# Patient Record
Sex: Male | Born: 1960 | Race: Black or African American | Hispanic: No | Marital: Married | Smoking: Current every day smoker
Health system: Southern US, Community
[De-identification: ages and names within clinical notes are randomized; demographics above are authoritative.]

## PROBLEM LIST (undated history)

## (undated) DIAGNOSIS — I10 Essential (primary) hypertension: Secondary | ICD-10-CM

## (undated) DIAGNOSIS — E785 Hyperlipidemia, unspecified: Secondary | ICD-10-CM

## (undated) HISTORY — DX: Hyperlipidemia, unspecified: E78.5

## (undated) HISTORY — DX: Essential (primary) hypertension: I10

---

## 1997-10-29 ENCOUNTER — Encounter: Admission: RE | Admit: 1997-10-29 | Discharge: 1997-10-29 | Payer: Self-pay | Admitting: *Deleted

## 2017-06-15 ENCOUNTER — Ambulatory Visit: Payer: 59 | Admitting: Urgent Care

## 2017-06-15 ENCOUNTER — Other Ambulatory Visit: Payer: Self-pay

## 2017-06-15 ENCOUNTER — Ambulatory Visit (INDEPENDENT_AMBULATORY_CARE_PROVIDER_SITE_OTHER): Payer: 59

## 2017-06-15 ENCOUNTER — Encounter: Payer: Self-pay | Admitting: Urgent Care

## 2017-06-15 VITALS — BP 126/84 | HR 91 | Resp 16 | Ht 72.0 in | Wt 212.0 lb

## 2017-06-15 DIAGNOSIS — Z114 Encounter for screening for human immunodeficiency virus [HIV]: Secondary | ICD-10-CM

## 2017-06-15 DIAGNOSIS — R05 Cough: Secondary | ICD-10-CM

## 2017-06-15 DIAGNOSIS — Z1159 Encounter for screening for other viral diseases: Secondary | ICD-10-CM | POA: Diagnosis not present

## 2017-06-15 DIAGNOSIS — Z130 Encounter for screening for diseases of the blood and blood-forming organs and certain disorders involving the immune mechanism: Secondary | ICD-10-CM | POA: Diagnosis not present

## 2017-06-15 DIAGNOSIS — R0602 Shortness of breath: Secondary | ICD-10-CM | POA: Diagnosis not present

## 2017-06-15 DIAGNOSIS — Z833 Family history of diabetes mellitus: Secondary | ICD-10-CM | POA: Diagnosis not present

## 2017-06-15 DIAGNOSIS — Z0000 Encounter for general adult medical examination without abnormal findings: Secondary | ICD-10-CM

## 2017-06-15 DIAGNOSIS — Z1211 Encounter for screening for malignant neoplasm of colon: Secondary | ICD-10-CM

## 2017-06-15 DIAGNOSIS — F17200 Nicotine dependence, unspecified, uncomplicated: Secondary | ICD-10-CM | POA: Diagnosis not present

## 2017-06-15 DIAGNOSIS — R0789 Other chest pain: Secondary | ICD-10-CM | POA: Diagnosis not present

## 2017-06-15 DIAGNOSIS — R059 Cough, unspecified: Secondary | ICD-10-CM

## 2017-06-15 DIAGNOSIS — Z131 Encounter for screening for diabetes mellitus: Secondary | ICD-10-CM | POA: Diagnosis not present

## 2017-06-15 DIAGNOSIS — Z13220 Encounter for screening for lipoid disorders: Secondary | ICD-10-CM | POA: Diagnosis not present

## 2017-06-15 DIAGNOSIS — R079 Chest pain, unspecified: Secondary | ICD-10-CM | POA: Diagnosis not present

## 2017-06-15 DIAGNOSIS — R5383 Other fatigue: Secondary | ICD-10-CM

## 2017-06-15 DIAGNOSIS — Z1329 Encounter for screening for other suspected endocrine disorder: Secondary | ICD-10-CM

## 2017-06-15 DIAGNOSIS — Z23 Encounter for immunization: Secondary | ICD-10-CM | POA: Diagnosis not present

## 2017-06-15 MED ORDER — ALBUTEROL SULFATE HFA 108 (90 BASE) MCG/ACT IN AERS: 2.0000 | INHALATION_SPRAY | Freq: Four times a day (QID) | RESPIRATORY_TRACT | 1 refills | Status: DC | PRN

## 2017-06-15 NOTE — Progress Notes (Addendum)
MRN: [id number]  Subjective:   [name] is a 57 y.o. male presenting for annual physical exam and office visit for chest pain. Reports 1 week history of lower chest pain, fatigue, shortness of breath, malaise. Patient has 20 pack year smoking history of is currently smoking 1/2ppd, is ready to quit smoking. Denies ROS as below. Works as a Systems analyst. Patient is married, has 4 kids. Has good relationships at home, has a good support network.   Medical care team includes: PCP: Patient, No Pcp Per Vision: Had an eye exam done 2 months ago, wearing new glasses. Dental: Has upper dentures, has an appointment for dental care later today. Specialists: None.  Health Maintenance: Has never had a colonoscopy.  Jerry Greene is not currently taking any medications and has No Known Allergies. Jerry Greene denies past medical and surgical history. His family history includes Diabetes in his brother, mother, and sister.  Immunizations: Will update TDAP today. Refused influenza vaccine.  Review of Systems  Constitutional: Positive for malaise/fatigue. Negative for chills, diaphoresis, fever and weight loss.  HENT: Negative for congestion, ear discharge, ear pain, hearing loss, nosebleeds, sore throat and tinnitus.   Eyes: Negative for blurred vision, double vision, photophobia, pain, discharge and redness.  Respiratory: Positive for cough and shortness of breath. Negative for wheezing.   Cardiovascular: Positive for chest pain. Negative for palpitations and leg swelling.  Gastrointestinal: Negative for abdominal pain, blood in stool, constipation, diarrhea, nausea and vomiting.  Genitourinary: Negative for dysuria, flank pain, frequency, hematuria and urgency.  Musculoskeletal: Negative for back pain, joint pain and myalgias.  Skin: Negative for itching and rash.  Neurological: Negative for dizziness, tingling, seizures, loss of consciousness, weakness and headaches.  Endo/Heme/Allergies:  Negative for polydipsia.  Psychiatric/Behavioral: Negative for depression, hallucinations, memory loss, substance abuse and suicidal ideas. The patient is not nervous/anxious and does not have insomnia.    Objective:   Vitals: BP 126/84   Pulse 91   Resp 16   Ht 6' (1.829 m)   Wt 212 lb (96.2 kg)   SpO2 98%   BMI 28.75 kg/m   The 10-year ASCVD risk score Denman George DC Jr., et al., 2013) is: 11.5%   Values used to calculate the score:     Age: 32 years     Sex: Male     Is Non-Hispanic African American: Yes     Diabetic: No     Tobacco smoker: Yes     Systolic Blood Pressure: 126 mmHg     Is BP treated: No     HDL Cholesterol: 45 mg/dL     Total Cholesterol: 185 mg/dL   Physical Exam  Constitutional: He is oriented to person, place, and time. He appears well-developed and well-nourished.  HENT:  TM's intact bilaterally, no effusions or erythema. Nasal turbinates pink and moist, nasal passages patent. No sinus tenderness. Oropharynx clear, mucous membranes moist, dentition in good repair.  Eyes: Conjunctivae and EOM are normal. Pupils are equal, round, and reactive to light. Right eye exhibits no discharge. Left eye exhibits no discharge. No scleral icterus.  Neck: Normal range of motion. Neck supple. No thyromegaly present.  Cardiovascular: Normal rate, regular rhythm and intact distal pulses. Exam reveals no gallop and no friction rub.  No murmur heard. Pulmonary/Chest: No stridor. No respiratory distress. He has no wheezes. He has no rales.  Abdominal: Soft. Bowel sounds are normal. He exhibits no distension and no mass. There is no tenderness.  Musculoskeletal: Normal range of motion. He  exhibits no edema or tenderness.  Lymphadenopathy:    He has no cervical adenopathy.  Neurological: He is alert and oriented to person, place, and time. He has normal reflexes. He displays normal reflexes. Coordination normal.  Skin: Skin is warm and dry. No rash noted. No erythema. No pallor.   Psychiatric: He has a normal mood and affect.   Dg Chest 2 View  Addendum Date: 06/15/2017   ADDENDUM REPORT: 06/15/2017 12:10 ADDENDUM: Nipple markers have been placed and the correspond to the right basilar nodular density. The nodular density is therefore a nipple shadow. There is no evidence of true pulmonary nodule. Emphysema is noted. Electronically Signed   By: Jolaine ClickArthur  Hoss M.D.   On: 06/15/2017 12:10   Result Date: 06/15/2017 CLINICAL DATA:  One-week history of cough and chest pain. Tobacco use. EXAM: CHEST  2 VIEW COMPARISON:  None. FINDINGS: Normal heart size. Right basilar nodular density projects over the anterior sixth rib. No pneumothorax. No pleural effusion. Bullous and emphysematous changes in the upper lung zones. IMPRESSION: Right basilar nodular density. Repeat with nipple markers is recommended. Electronically Signed: By: Jolaine ClickArthur  Hoss M.D. On: 06/15/2017 11:43   ECG interpretation - normal sinus rhythm at 85bpm.  Assessment and Plan :   Annual physical exam  Screening for diabetes mellitus - Plan: Hemoglobin A1c, CANCELED: COMPLETE METABOLIC PANEL WITH GFR  Family history of diabetes mellitus - Plan: Hemoglobin A1c  Screening for deficiency anemia - Plan: CBC  Screening for thyroid disorder - Plan: TSH  Screening for hyperlipidemia - Plan: Lipid panel  Atypical chest pain - Plan: EKG 12-Lead, DG Chest 2 View, Comprehensive metabolic panel, Ambulatory referral to Cardiology  Cough - Plan: EKG 12-Lead, DG Chest 2 View  Other fatigue - Plan: EKG 12-Lead, DG Chest 2 View  Screening for HIV (human immunodeficiency virus) - Plan: HIV antibody  Shortness of breath - Plan: EKG 12-Lead, DG Chest 2 View  Encounter for hepatitis C screening test for low risk patient - Plan: Hepatitis C antibody  Screen for colon cancer - Plan: Ambulatory referral to Gastroenterology  Tobacco use disorder - Plan: Ambulatory referral to Cardiology  Annual physical - Medically stable,  labs pending. Discussed healthy lifestyle, diet, exercise, preventative care, vaccinations, and addressed patient's concerns. Patient declined the flu vaccine, agreed to the tdap. Referral for colonoscopy is pending.   Chest pain - Likely due to viral illness, complicated by his smoking. Reports that he is asymptomatic today. Offered albuterol inhaler. Will consider referral to pulmonology after patient obtains consult with cardiology for his chest pain. ER and return-to-clinic precautions discussed, patient verbalized understanding.   Wallis BambergMario Ashleymarie Granderson, PA-C [facility] [phone] 06/15/2017  11:04 AM

## 2017-06-15 NOTE — Patient Instructions (Signed)
Nonspecific Chest Pain Chest pain can be caused by many different conditions. There is always a chance that your pain could be related to something serious, such as a heart attack or a blood clot in your lungs. Chest pain can also be caused by conditions that are not life-threatening. If you have chest pain, it is very important to follow up with your health care provider. What are the causes? Causes of this condition include:  Heartburn.  Pneumonia or bronchitis.  Anxiety or stress.  Inflammation around your heart (pericarditis) or lung (pleuritis or pleurisy).  A blood clot in your lung.  A collapsed lung (pneumothorax). This can develop suddenly on its own (spontaneous pneumothorax) or from trauma to the chest.  Shingles infection (varicella-zoster virus).  Heart attack.  Damage to the bones, muscles, and cartilage that make up your chest wall. This can include: ? Bruised bones due to injury. ? Strained muscles or cartilage due to frequent or repeated coughing or overwork. ? Fracture to one or more ribs. ? Sore cartilage due to inflammation (costochondritis).  What increases the risk? Risk factors for this condition may include:  Activities that increase your risk for trauma or injury to your chest.  Respiratory infections or conditions that cause frequent coughing.  Medical conditions or overeating that can cause heartburn.  Heart disease or family history of heart disease.  Conditions or health behaviors that increase your risk of developing a blood clot.  Having had chicken pox (varicella zoster).  What are the signs or symptoms? Chest pain can feel like:  Burning or tingling on the surface of your chest or deep in your chest.  Crushing, pressure, aching, or squeezing pain.  Dull or sharp pain that is worse when you move, cough, or take a deep breath.  Pain that is also felt in your back, neck, shoulder, or arm, or pain that spreads to any of these  areas.  Your chest pain may come and go, or it may stay constant. How is this diagnosed? Lab tests or other studies may be needed to find the cause of your pain. Your health care provider may have you take a test called an ECG (electrocardiogram). An ECG records your heartbeat patterns at the time the test is performed. You may also have other tests, such as:  Transthoracic echocardiogram (TTE). In this test, sound waves are used to create a picture of the heart structures and to look at how blood flows through your heart.  Transesophageal echocardiogram (TEE).This is a more advanced imaging test that takes images from inside your body. It allows your health care provider to see your heart in finer detail.  Cardiac monitoring. This allows your health care provider to monitor your heart rate and rhythm in real time.  Holter monitor. This is a portable device that records your heartbeat and can help to diagnose abnormal heartbeats. It allows your health care provider to track your heart activity for several days, if needed.  Stress tests. These can be done through exercise or by taking medicine that makes your heart beat more quickly.  Blood tests.  Other imaging tests.  How is this treated? Treatment depends on what is causing your chest pain. Treatment may include:  Medicines. These may include: ? Acid blockers for heartburn. ? Anti-inflammatory medicine. ? Pain medicine for inflammatory conditions. ? Antibiotic medicine, if an infection is present. ? Medicines to dissolve blood clots. ? Medicines to treat coronary artery disease (CAD).  Supportive care for conditions that   do not require medicines. This may include: ? Resting. ? Applying heat or cold packs to injured areas. ? Limiting activities until pain decreases.  Follow these instructions at home: Medicines  If you were prescribed an antibiotic, take it as told by your health care provider. Do not stop taking the  antibiotic even if you start to feel better.  Take over-the-counter and prescription medicines only as told by your health care provider. Lifestyle  Do not use any products that contain nicotine or tobacco, such as cigarettes and e-cigarettes. If you need help quitting, ask your health care provider.  Do not drink alcohol.  Make lifestyle changes as directed by your health care provider. These may include: ? Getting regular exercise. Ask your health care provider to suggest some activities that are safe for you. ? Eating a heart-healthy diet. A registered dietitian can help you to learn healthy eating options. ? Maintaining a healthy weight. ? Managing diabetes, if necessary. ? Reducing stress, such as with yoga or relaxation techniques. General instructions  Avoid any activities that bring on chest pain.  If heartburn is the cause for your chest pain, raise (elevate) the head of your bed about 6 inches (15 cm) by putting blocks under the legs. Sleeping with more pillows does not effectively relieve heartburn because it only changes the position of your head.  Keep all follow-up visits as told by your health care provider. This is important. This includes any further testing if your chest pain does not go away. Contact a health care provider if:  Your chest pain does not go away.  You have a rash with blisters on your chest.  You have a fever.  You have chills. Get help right away if:  Your chest pain is worse.  You have a cough that gets worse, or you cough up blood.  You have severe pain in your abdomen.  You have severe weakness.  You faint.  You have sudden, unexplained chest discomfort.  You have sudden, unexplained discomfort in your arms, back, neck, or jaw.  You have shortness of breath at any time.  You suddenly start to sweat, or your skin gets clammy.  You feel nauseous or you vomit.  You suddenly feel light-headed or dizzy.  Your heart begins to beat  quickly, or it feels like it is skipping beats. These symptoms may represent a serious problem that is an emergency. Do not wait to see if the symptoms will go away. Get medical help right away. Call your local emergency services (911 in the [place] Do not drive yourself to the hospital. This information is not intended to replace advice given to you by your health care provider. Make sure you discuss any questions you have with your health care provider. Document Released: 02/22/2005 Document Revised: 02/07/2016 Document Reviewed: 02/07/2016 Elsevier Interactive Patient Education  2017 [employer]    Health Maintenance, Male A healthy lifestyle and preventive care is important for your health and wellness. Ask your health care provider about what schedule of regular examinations is right for you. What should I know about weight and diet? Eat a Healthy Diet  Eat plenty of vegetables, fruits, whole grains, low-fat dairy products, and lean protein.  Do not eat a lot of foods high in solid fats, added sugars, or salt.  Maintain a Healthy Weight Regular exercise can help you achieve or maintain a healthy weight. You should:  Do at least 150 minutes of exercise each week. The exercise should increase  your heart rate and make you sweat (moderate-intensity exercise).  Do strength-training exercises at least twice a week.  Watch Your Levels of Cholesterol and Blood Lipids  Have your blood tested for lipids and cholesterol every 5 years starting at 57 years of age. If you are at high risk for heart disease, you should start having your blood tested when you are 57 years old. You may need to have your cholesterol levels checked more often if: ? Your lipid or cholesterol levels are high. ? You are older than 57 years of age. ? You are at high risk for heart disease.  What should I know about cancer screening? Many types of cancers can be detected early and may often be prevented. Lung  Cancer  You should be screened every year for lung cancer if: ? You are a current smoker who has smoked for at least 30 years. ? You are a former smoker who has quit within the past 15 years.  Talk to your health care provider about your screening options, when you should start screening, and how often you should be screened.  Colorectal Cancer  Routine colorectal cancer screening usually begins at 57 years of age and should be repeated every 5-10 years until you are 57 years old. You may need to be screened more often if early forms of precancerous polyps or small growths are found. Your health care provider may recommend screening at an earlier age if you have risk factors for colon cancer.  Your health care provider may recommend using home test kits to check for hidden blood in the stool.  A small camera at the end of a tube can be used to examine your colon (sigmoidoscopy or colonoscopy). This checks for the earliest forms of colorectal cancer.  Prostate and Testicular Cancer  Depending on your age and overall health, your health care provider may do certain tests to screen for prostate and testicular cancer.  Talk to your health care provider about any symptoms or concerns you have about testicular or prostate cancer.  Skin Cancer  Check your skin from head to toe regularly.  Tell your health care provider about any new moles or changes in moles, especially if: ? There is a change in a mole's size, shape, or color. ? You have a mole that is larger than a pencil eraser.  Always use sunscreen. Apply sunscreen liberally and repeat throughout the day.  Protect yourself by wearing long sleeves, pants, a wide-brimmed hat, and sunglasses when outside.  What should I know about heart disease, diabetes, and high blood pressure?  If you are 24-75 years of age, have your blood pressure checked every 3-5 years. If you are 47 years of age or older, have your blood pressure checked every  year. You should have your blood pressure measured twice-once when you are at a hospital or clinic, and once when you are not at a hospital or clinic. Record the average of the two measurements. To check your blood pressure when you are not at a hospital or clinic, you can use: ? An automated blood pressure machine at a pharmacy. ? A home blood pressure monitor.  Talk to your health care provider about your target blood pressure.  If you are between 62-60 years old, ask your health care provider if you should take aspirin to prevent heart disease.  Have regular diabetes screenings by checking your fasting blood sugar level. ? If you are at a normal weight and have a  low risk for diabetes, have this test once every three years after the age of 80. ? If you are overweight and have a high risk for diabetes, consider being tested at a younger age or more often.  A one-time screening for abdominal aortic aneurysm (AAA) by ultrasound is recommended for men aged 65-75 years who are current or former smokers. What should I know about preventing infection? Hepatitis B If you have a higher risk for hepatitis B, you should be screened for this virus. Talk with your health care provider to find out if you are at risk for hepatitis B infection. Hepatitis C Blood testing is recommended for:  Everyone born from [birth year] through 1965.  Anyone with known risk factors for hepatitis C.  Sexually Transmitted Diseases (STDs)  You should be screened each year for STDs including gonorrhea and chlamydia if: ? You are sexually active and are younger than 57 years of age. ? You are older than 57 years of age and your health care provider tells you that you are at risk for this type of infection. ? Your sexual activity has changed since you were last screened and you are at an increased risk for chlamydia or gonorrhea. Ask your health care provider if you are at risk.  Talk with your health care provider about  whether you are at high risk of being infected with HIV. Your health care provider may recommend a prescription medicine to help prevent HIV infection.  What else can I do?  Schedule regular health, dental, and eye exams.  Stay current with your vaccines (immunizations).  Do not use any tobacco products, such as cigarettes, chewing tobacco, and e-cigarettes. If you need help quitting, ask your health care provider.  Limit alcohol intake to no more than 2 drinks per day. One drink equals 12 ounces of beer, 5 ounces of wine, or 1 ounces of hard liquor.  Do not use street drugs.  Do not share needles.  Ask your health care provider for help if you need support or information about quitting drugs.  Tell your health care provider if you often feel depressed.  Tell your health care provider if you have ever been abused or do not feel safe at home. This information is not intended to replace advice given to you by your health care provider. Make sure you discuss any questions you have with your health care provider. Document Released: 11/11/2007 Document Revised: 01/12/2016 Document Reviewed: 02/16/2015 Elsevier Interactive Patient Education  [employer].

## 2017-06-16 LAB — COMPREHENSIVE METABOLIC PANEL
A/G RATIO: 1.9 (ref 1.2–2.2)
ALBUMIN: 4.8 g/dL (ref 3.5–5.5)
ALT: 8 IU/L (ref 0–44)
AST: 10 IU/L (ref 0–40)
Alkaline Phosphatase: 70 IU/L (ref 39–117)
BUN / CREAT RATIO: 8 — AB (ref 9–20)
BUN: 12 mg/dL (ref 6–24)
Bilirubin Total: 0.3 mg/dL (ref 0.0–1.2)
CALCIUM: 10 mg/dL (ref 8.7–10.2)
CO2: 21 mmol/L (ref 20–29)
CREATININE: 1.43 mg/dL — AB (ref 0.76–1.27)
Chloride: 104 mmol/L (ref 96–106)
GFR, EST AFRICAN AMERICAN: 63 mL/min/{1.73_m2} (ref >59)
GFR, EST NON AFRICAN AMERICAN: 54 mL/min/{1.73_m2} — AB (ref >59)
GLOBULIN, TOTAL: 2.5 g/dL (ref 1.5–4.5)
Glucose: 109 mg/dL — ABNORMAL HIGH (ref 65–99)
POTASSIUM: 4.5 mmol/L (ref 3.5–5.2)
SODIUM: 141 mmol/L (ref 134–144)
Total Protein: 7.3 g/dL (ref 6.0–8.5)

## 2017-06-16 LAB — LIPID PANEL
CHOL/HDL RATIO: 4.1 ratio (ref 0.0–5.0)
CHOLESTEROL TOTAL: 185 mg/dL (ref 100–199)
HDL: 45 mg/dL (ref >39)
LDL Calculated: 126 mg/dL — ABNORMAL HIGH (ref 0–99)
TRIGLYCERIDES: 68 mg/dL (ref 0–149)
VLDL Cholesterol Cal: 14 mg/dL (ref 5–40)

## 2017-06-16 LAB — HEMOGLOBIN A1C
ESTIMATED AVERAGE GLUCOSE: 126 mg/dL
Hgb A1c MFr Bld: 6 % — ABNORMAL HIGH (ref 4.8–5.6)

## 2017-06-16 LAB — CBC
HEMOGLOBIN: 14.7 g/dL (ref 13.0–17.7)
Hematocrit: 43.2 % (ref 37.5–51.0)
MCH: 26.9 pg (ref 26.6–33.0)
MCHC: 34 g/dL (ref 31.5–35.7)
MCV: 79 fL (ref 79–97)
Platelets: 182 10*3/uL (ref 150–379)
RBC: 5.47 x10E6/uL (ref 4.14–5.80)
RDW: 15.4 % (ref 12.3–15.4)
WBC: 5.9 10*3/uL (ref 3.4–10.8)

## 2017-06-16 LAB — HIV ANTIBODY (ROUTINE TESTING W REFLEX): HIV Screen 4th Generation wRfx: NONREACTIVE

## 2017-06-16 LAB — TSH: TSH: 0.918 u[IU]/mL (ref 0.450–4.500)

## 2017-06-16 LAB — HEPATITIS C ANTIBODY: Hep C Virus Ab: <0.1 s/co ratio (ref 0.0–0.9)

## 2017-06-18 ENCOUNTER — Other Ambulatory Visit: Payer: Self-pay | Admitting: Urgent Care

## 2017-06-18 MED ORDER — ATORVASTATIN CALCIUM 20 MG PO TABS: 20.0000 mg | ORAL_TABLET | Freq: Every day | ORAL | 3 refills | Status: DC

## 2017-06-25 ENCOUNTER — Encounter: Payer: Self-pay | Admitting: Gastroenterology

## 2017-07-06 DIAGNOSIS — R0789 Other chest pain: Secondary | ICD-10-CM | POA: Diagnosis not present

## 2017-07-06 DIAGNOSIS — Z720 Tobacco use: Secondary | ICD-10-CM | POA: Diagnosis not present

## 2017-07-06 DIAGNOSIS — E7800 Pure hypercholesterolemia, unspecified: Secondary | ICD-10-CM | POA: Diagnosis not present

## 2017-07-19 DIAGNOSIS — R0789 Other chest pain: Secondary | ICD-10-CM | POA: Diagnosis not present

## 2017-07-20 DIAGNOSIS — R0789 Other chest pain: Secondary | ICD-10-CM | POA: Diagnosis not present

## 2017-07-27 ENCOUNTER — Ambulatory Visit: Payer: 59 | Admitting: Urgent Care

## 2017-07-27 ENCOUNTER — Encounter: Payer: Self-pay | Admitting: Urgent Care

## 2017-07-27 VITALS — BP 163/92 | HR 73 | Temp 98.1°F | Resp 18 | Ht 72.0 in | Wt 206.8 lb

## 2017-07-27 DIAGNOSIS — R030 Elevated blood-pressure reading, without diagnosis of hypertension: Secondary | ICD-10-CM

## 2017-07-27 DIAGNOSIS — R0789 Other chest pain: Secondary | ICD-10-CM | POA: Diagnosis not present

## 2017-07-27 DIAGNOSIS — E7800 Pure hypercholesterolemia, unspecified: Secondary | ICD-10-CM | POA: Diagnosis not present

## 2017-07-27 DIAGNOSIS — F17200 Nicotine dependence, unspecified, uncomplicated: Secondary | ICD-10-CM | POA: Diagnosis not present

## 2017-07-27 DIAGNOSIS — Z720 Tobacco use: Secondary | ICD-10-CM | POA: Diagnosis not present

## 2017-07-27 DIAGNOSIS — I10 Essential (primary) hypertension: Secondary | ICD-10-CM

## 2017-07-27 MED ORDER — NICOTINE 14 MG/24HR TD PT24: 14.0000 mg | MEDICATED_PATCH | Freq: Every day | TRANSDERMAL | 0 refills | Status: DC

## 2017-07-27 MED ORDER — AMLODIPINE BESYLATE 5 MG PO TABS: 5.0000 mg | ORAL_TABLET | Freq: Every day | ORAL | 1 refills | Status: DC

## 2017-07-27 NOTE — Patient Instructions (Addendum)
Nicotine skin patches What is this medicine? NICOTINE (NIK [place]) helps people stop smoking. The patches replace the nicotine found in cigarettes and help to decrease withdrawal effects. They are most effective when used in combination with a stop-smoking program. This medicine may be used for other purposes; ask your health care provider or pharmacist if you have questions. COMMON BRAND NAME(S): Habitrol, Nicoderm CQ, Nicotrol What should I tell my health care provider before I take this medicine? They need to know if you have any of these conditions: -diabetes -heart disease, angina, irregular heartbeat or previous heart attack -high blood pressure -lung disease, including asthma -overactive thyroid -pheochromocytoma -seizures or a history of seizures -skin problems, like eczema -stomach problems or ulcers -an unusual or allergic reaction to nicotine, adhesives, other medicines, foods, dyes, or preservatives -pregnant or trying to get pregnant -breast-feeding How should I use this medicine? This medicine is for use on the skin. Follow the directions that come with the patches. Find an area of skin on your upper arm, chest, or back that is clean, dry, greaseless, undamaged and hairless. Wash hands with plain soap and water. Do not use anything that contains aloe, lanolin or glycerin as these may prevent the patch from sticking. Dry thoroughly. Remove the patch from the sealed pouch. Do not try to cut or trim the patch. Using your palm, press the patch firmly in place for 10 seconds to make sure that there is good contact with your skin. After applying the patch, wash your hands. Change the patch every day, keeping to a regular schedule. When you apply a new patch, use a new area of skin. Wait at least 1 week before using the same area again. Talk to your pediatrician regarding the use of this medicine in children. Special care may be needed. Overdosage: If you think you have taken too much  of this medicine contact a poison control center or emergency room at once. NOTE: This medicine is only for you. Do not share this medicine with others. What if I miss a dose? If you forget to replace a patch, use it as soon as you can. Only use one patch at a time and do not leave on the skin for longer than directed. If a patch falls off, you can replace it, but keep to your schedule and remove the patch at the right time. What may interact with this medicine? -medicines for asthma -medicines for blood pressure -medicines for mental depression This list may not describe all possible interactions. Give your health care provider a list of all the medicines, herbs, non-prescription drugs, or dietary supplements you use. Also tell them if you smoke, drink alcohol, or use illegal drugs. Some items may interact with your medicine. What should I watch for while using this medicine? You should begin using the nicotine patch the day you stop smoking. It is okay if you do not succeed at your attempt to quit and have a cigarette. You can still continue your quit attempt and keep using the product as directed. Just throw away your cigarettes and get back to your quit plan. You can keep the patch in place during swimming, bathing, and showering. If your patch falls off during these activities, replace it. When you first apply the patch, your skin may itch or burn. This should go away soon. When you remove a patch, the skin may look red, but this should only last for a few days. Call your doctor or health care professional   if skin redness does not go away after 4 days, if your skin swells, or if you get a rash. If you are a diabetic and you quit smoking, the effects of insulin may be increased and you may need to reduce your insulin dose. Check with your doctor or health care professional about how you should adjust your insulin dose. If you are going to have a magnetic resonance imaging (MRI) procedure, tell your  MRI technician if you have this patch on your body. It must be removed before a MRI. What side effects may I notice from receiving this medicine? Side effects that you should report to your doctor or health care professional as soon as possible: -allergic reactions like skin rash, itching or hives, swelling of the face, lips, or tongue -breathing problems -changes in hearing -changes in vision -chest pain -cold sweats -confusion -fast, irregular heartbeat -feeling faint or lightheaded, falls -headache -increased saliva -skin redness that lasts more than 4 days -stomach pain -signs and symptoms of nicotine overdose like nausea; vomiting; dizziness; weakness; and rapid heartbeat Side effects that usually do not require medical attention (report to your doctor or health care professional if they continue or are bothersome): -diarrhea -dry mouth -hiccups -irritability -nervousness or restlessness -trouble sleeping or vivid dreams This list may not describe all possible side effects. Call your doctor for medical advice about side effects. You may report side effects to FDA at 1-800-FDA-1088. Where should I keep my medicine? Keep out of the reach of children. Store at room temperature between 20 and 25 degrees C (68 and 77 degrees F). Protect from heat and light. Store in Tax inspector until ready to use. Throw away unused medicine after the expiration date. When you remove a patch, fold with sticky sides together; put in an empty opened pouch and throw away. NOTE: This sheet is a summary. It may not cover all possible information. If you have questions about this medicine, talk to your doctor, pharmacist, or health care provider.  2018 Elsevier/Gold Standard (2014-04-13 15:46:21)      Steps to Quit Smoking Smoking tobacco can be harmful to your health and can affect almost every organ in your body. Smoking puts you, and those around you, at risk for developing many serious  chronic diseases. Quitting smoking is difficult, but it is one of the best things that you can do for your health. It is never too late to quit. What are the benefits of quitting smoking? When you quit smoking, you lower your risk of developing serious diseases and conditions, such as:  Lung cancer or lung disease, such as COPD.  Heart disease.  Stroke.  Heart attack.  Infertility.  Osteoporosis and bone fractures.  Additionally, symptoms such as coughing, wheezing, and shortness of breath may get better when you quit. You may also find that you get sick less often because your body is stronger at fighting off colds and infections. If you are pregnant, quitting smoking can help to reduce your chances of having a baby of low birth weight. How do I get ready to quit? When you decide to quit smoking, create a plan to make sure that you are successful. Before you quit:  Pick a date to quit. Set a date within the next two weeks to give you time to prepare.  Write down the reasons why you are quitting. Keep this list in places where you will see it often, such as on your bathroom mirror or in your car or wallet.  Identify the people, places, things, and activities that make you want to smoke (triggers) and avoid them. Make sure to take these actions: ? Throw away all cigarettes at home, at work, and in your car. ? Throw away smoking accessories, such as Set designer. ? Clean your car and make sure to empty the ashtray. ? Clean your home, including curtains and carpets.  Tell your family, friends, and coworkers that you are quitting. Support from your loved ones can make quitting easier.  Talk with your health care provider about your options for quitting smoking.  Find out what treatment options are covered by your health insurance.  What strategies can I use to quit smoking? Talk with your healthcare provider about different strategies to quit smoking. Some strategies  include:  Quitting smoking altogether instead of gradually lessening how much you smoke over a period of time. Research shows that quitting "cold [place]" is more successful than gradually quitting.  Attending in-person counseling to help you build problem-solving skills. You are more likely to have success in quitting if you attend several counseling sessions. Even short sessions of 10 minutes can be effective.  Finding resources and support systems that can help you to quit smoking and remain smoke-free after you quit. These resources are most helpful when you use them often. They can include: ? Online chats with a Veterinary surgeon. ? Telephone quitlines. ? Automotive engineer. ? Support groups or group counseling. ? Text messaging programs. ? Mobile phone applications.  Taking medicines to help you quit smoking. (If you are pregnant or breastfeeding, talk with your health care provider first.) Some medicines contain nicotine and some do not. Both types of medicines help with cravings, but the medicines that include nicotine help to relieve withdrawal symptoms. Your health care provider may recommend: ? Nicotine patches, gum, or lozenges. ? Nicotine inhalers or sprays. ? Non-nicotine medicine that is taken by mouth.  Talk with your health care provider about combining strategies, such as taking medicines while you are also receiving in-person counseling. Using these two strategies together makes you more likely to succeed in quitting than if you used either strategy on its own. If you are pregnant or breastfeeding, talk with your health care provider about finding counseling or other support strategies to quit smoking. Do not take medicine to help you quit smoking unless told to do so by your health care provider. What things can I do to make it easier to quit? Quitting smoking might feel overwhelming at first, but there is a lot that you can do to make it easier. Take these important  actions:  Reach out to your family and friends and ask that they support and encourage you during this time. Call telephone quitlines, reach out to support groups, or work with a counselor for support.  Ask people who smoke to avoid smoking around you.  Avoid places that trigger you to smoke, such as bars, parties, or smoke-break areas at work.  Spend time around people who do not smoke.  Lessen stress in your life, because stress can be a smoking trigger for some people. To lessen stress, try: ? Exercising regularly. ? Deep-breathing exercises. ? Yoga. ? Meditating. ? Performing a body scan. This involves closing your eyes, scanning your body from head to toe, and noticing which parts of your body are particularly tense. Purposefully relax the muscles in those areas.  Download or purchase mobile phone or tablet apps (applications) that can help you stick to your quit  plan by providing reminders, tips, and encouragement. There are many free apps, such as QuitGuide from the [employer] developer(Centers for Disease Control and Prevention). You can find other support for quitting smoking (smoking cessation) through smokefree.gov and other websites.  How will I feel when I quit smoking? Within the first 24 hours of quitting smoking, you may start to feel some withdrawal symptoms. These symptoms are usually most noticeable 2-3 days after quitting, but they usually do not last beyond 2-3 weeks. Changes or symptoms that you might experience include:  Mood swings.  Restlessness, anxiety, or irritation.  Difficulty concentrating.  Dizziness.  Strong cravings for sugary foods in addition to nicotine.  Mild weight gain.  Constipation.  Nausea.  Coughing or a sore throat.  Changes in how your medicines work in your body.  A depressed mood.  Difficulty sleeping (insomnia).  After the first 2-3 weeks of quitting, you may start to notice more positive results, such as:  Improved sense of smell and  taste.  Decreased coughing and sore throat.  Slower heart rate.  Lower blood pressure.  Clearer skin.  The ability to breathe more easily.  Fewer sick days.  Quitting smoking is very challenging for most people. Do not get discouraged if you are not successful the first time. Some people need to make many attempts to quit before they achieve long-term success. Do your best to stick to your quit plan, and talk with your health care provider if you have any questions or concerns. This information is not intended to replace advice given to you by your health care provider. Make sure you discuss any questions you have with your health care provider. Document Released: 05/09/2001 Document Revised: 01/11/2016 Document Reviewed: 09/29/2014 Elsevier Interactive Patient Education  2018 Elsevier Inc.     High Cholesterol High cholesterol is a condition in which the blood has high levels of a white, waxy, fat-like substance (cholesterol). The human body needs small amounts of cholesterol. The liver makes all the cholesterol that the body needs. Extra (excess) cholesterol comes from the food that we eat. Cholesterol is carried from the liver by the blood through the blood vessels. If you have high cholesterol, deposits (plaques) may build up on the walls of your blood vessels (arteries). Plaques make the arteries narrower and stiffer. Cholesterol plaques increase your risk for heart attack and stroke. Work with your health care provider to keep your cholesterol levels in a healthy range. What increases the risk? This condition is more likely to develop in people who:  Eat foods that are high in animal fat (saturated fat) or cholesterol.  Are overweight.  Are not getting enough exercise.  Have a family history of high cholesterol.  What are the signs or symptoms? There are no symptoms of this condition. How is this diagnosed? This condition may be diagnosed from the results of a blood  test.  If you are older than age 57, your health care provider may check your cholesterol every 4-6 years.  You may be checked more often if you already have high cholesterol or other risk factors for heart disease.  The blood test for cholesterol measures:  "Bad" cholesterol (LDL cholesterol). This is the main type of cholesterol that causes heart disease. The desired level for LDL is less than 100.  "Good" cholesterol (HDL cholesterol). This type helps to protect against heart disease by cleaning the arteries and carrying the LDL away. The desired level for HDL is 60 or higher.  Triglycerides. These  are fats that the body can store or burn for energy. The desired number for triglycerides is lower than 150.  Total cholesterol. This is a measure of the total amount of cholesterol in your blood, including LDL cholesterol, HDL cholesterol, and triglycerides. A healthy number is less than 200.  How is this treated? This condition is treated with diet changes, lifestyle changes, and medicines. Diet changes  This may include eating more whole grains, fruits, vegetables, nuts, and fish.  This may also include cutting back on red meat and foods that have a lot of added sugar. Lifestyle changes  Changes may include getting at least 40 minutes of aerobic exercise 3 times a week. Aerobic exercises include walking, biking, and swimming. Aerobic exercise along with a healthy diet can help you maintain a healthy weight.  Changes may also include quitting smoking. Medicines  Medicines are usually given if diet and lifestyle changes have failed to reduce your cholesterol to healthy levels.  Your health care provider may prescribe a statin medicine. Statin medicines have been shown to reduce cholesterol, which can reduce the risk of heart disease. Follow these instructions at home: Eating and drinking  If told by your health care provider:  Eat chicken (without skin), fish, veal, shellfish,  ground [place] breast, and round or loin cuts of red meat.  Do not eat fried foods or fatty meats, such as hot dogs and salami.  Eat plenty of fruits, such as apples.  Eat plenty of vegetables, such as broccoli, potatoes, and carrots.  Eat beans, peas, and lentils.  Eat grains such as barley, rice, couscous, and bulgur wheat.  Eat pasta without cream sauces.  Use skim or nonfat milk, and eat low-fat or nonfat yogurt and cheeses.  Do not eat or drink whole milk, cream, ice cream, egg yolks, or hard cheeses.  Do not eat stick margarine or tub margarines that contain trans fats (also called partially hydrogenated oils).  Do not eat saturated tropical oils, such as coconut oil and palm oil.  Do not eat cakes, cookies, crackers, or other baked goods that contain trans fats.  General instructions  Exercise as directed by your health care provider. Increase your activity level with activities such as gardening, walking, and taking the stairs.  Take over-the-counter and prescription medicines only as told by your health care provider.  Do not use any products that contain nicotine or tobacco, such as cigarettes and e-cigarettes. If you need help quitting, ask your health care provider.  Keep all follow-up visits as told by your health care provider. This is important. Contact a health care provider if:  You are struggling to maintain a healthy diet or weight.  You need help to start on an exercise program.  You need help to stop smoking. Get help right away if:  You have chest pain.  You have trouble breathing. This information is not intended to replace advice given to you by your health care provider. Make sure you discuss any questions you have with your health care provider. Document Released: 05/15/2005 Document Revised: 12/11/2015 Document Reviewed: 11/13/2015 Elsevier Interactive Patient Education  2018 [employer].     Preventing Type 2 Diabetes Mellitus Type 2  diabetes (type 2 diabetes mellitus) is a long-term (chronic) disease that affects blood sugar (glucose) levels. Normally, a hormone called insulin allows glucose to enter cells in the body. The cells use glucose for energy. In type 2 diabetes, one or both of these problems may be present:  The  body does not make enough insulin.  The body does not respond properly to insulin that it makes (insulin resistance).  Insulin resistance or lack of insulin causes excess glucose to build up in the blood instead of going into cells. As a result, high blood glucose (hyperglycemia) develops, which can cause many complications. Being overweight or obese and having an inactive (sedentary) lifestyle can increase your risk for diabetes. Type 2 diabetes can be delayed or prevented by making certain nutrition and lifestyle changes. What nutrition changes can be made?  Eat healthy meals and snacks regularly. Keep a healthy snack with you for when you get hungry between meals, such as fruit or a handful of nuts.  Eat lean meats and proteins that are low in saturated fats, such as chicken, fish, egg whites, and beans. Avoid processed meats.  Eat plenty of fruits and vegetables and plenty of grains that have not been processed (whole grains). It is recommended that you eat: ? 1?2 cups of fruit every day. ? 2?3 cups of vegetables every day. ? 6?8 oz of whole grains every day, such as oats, whole wheat, bulgur, brown rice, quinoa, and millet.  Eat low-fat dairy products, such as milk, yogurt, and cheese.  Eat foods that contain healthy fats, such as nuts, avocado, olive oil, and canola oil.  Drink water throughout the day. Avoid drinks that contain added sugar, such as soda or sweet tea.  Follow instructions from your health care provider about specific eating or drinking restrictions.  Control how much food you eat at a time (portion size). ? Check food labels to find out the serving sizes of foods. ? Use a  kitchen scale to weigh amounts of foods.  Saute or steam food instead of frying it. Cook with water or broth instead of oils or butter.  Limit your intake of: ? Salt (sodium). Have no more than 1 tsp (2,400 mg) of sodium a day. If you have heart disease or high blood pressure, have less than ? tsp (1,500 mg) of sodium a day. ? Saturated fat. This is fat that is solid at room temperature, such as butter or fat on meat. What lifestyle changes can be made?  Activity  Do moderate-intensity physical activity for at least 30 minutes on at least 5 days of the week, or as much as told by your health care provider.  Ask your health care provider what activities are safe for you. A mix of physical activities may be best, such as walking, swimming, cycling, and strength training.  Try to add physical activity into your day. For example: ? Park in spots that are farther away than usual, so that you walk more. For example, park in a far corner of the parking lot when you go to the office or the grocery store. ? Take a walk during your lunch break. ? Use stairs instead of elevators or escalators. Weight Loss  Lose weight as directed. Your health care provider can determine how much weight loss is best for you and can help you lose weight safely.  If you are overweight or obese, you may be instructed to lose at least 5?7 % of your body weight. Alcohol and Tobacco   Limit alcohol intake to no more than 1 drink a day for nonpregnant women and 2 drinks a day for men. One drink equals 12 oz of beer, 5 oz of wine, or 1 oz of hard liquor.  Do not use any tobacco products, such as cigarettes,  chewing tobacco, and e-cigarettes. If you need help quitting, ask your health care provider. Work With Your Health Care Provider  Have your blood glucose tested regularly, as told by your health care provider.  Discuss your risk factors and how you can reduce your risk for diabetes.  Get screening tests as told  by your health care provider. You may have screening tests regularly, especially if you have certain risk factors for type 2 diabetes.  Make an appointment with a diet and nutrition specialist (registered dietitian). A registered dietitian can help you make a healthy eating plan and can help you understand portion sizes and food labels. Why are these changes important?  It is possible to prevent or delay type 2 diabetes and related health problems by making lifestyle and nutrition changes.  It can be difficult to recognize signs of type 2 diabetes. The best way to avoid possible damage to your body is to take actions to prevent the disease before you develop symptoms. What can happen if changes are not made?  Your blood glucose levels may keep increasing. Having high blood glucose for a long time is dangerous. Too much glucose in your blood can damage your blood vessels, heart, kidneys, nerves, and eyes.  You may develop prediabetes or type 2 diabetes. Type 2 diabetes can lead to many chronic health problems and complications, such as: ? Heart disease. ? Stroke. ? Blindness. ? Kidney disease. ? Depression. ? Poor circulation in the feet and legs, which could lead to surgical removal (amputation) in severe cases. Where to find support:  Ask your health care provider to recommend a registered dietitian, diabetes educator, or weight loss program.  Look for local or online weight loss groups.  Join a gym, fitness club, or outdoor activity group, such as a walking club. Where to find more information: To learn more about diabetes and diabetes prevention, visit:  American Diabetes Association (ADA): www.diabetes.[employer] of Diabetes and Digestive and Kidney Diseases: ToyArticles.ca  To learn more about healthy eating, visit:  The U.S. Department of Agriculture Architect), Choose My Plate: http://yates.biz/  Office of Disease  Prevention and Health Promotion (ODPHP), Dietary Guidelines: ListingMagazine.si  Summary  You can reduce your risk for type 2 diabetes by increasing your physical activity, eating healthy foods, and losing weight as directed.  Talk with your health care provider about your risk for type 2 diabetes. Ask about any blood tests or screening tests that you need to have. This information is not intended to replace advice given to you by your health care provider. Make sure you discuss any questions you have with your health care provider. Document Released: 09/06/2015 Document Revised: 10/21/2015 Document Reviewed: 07/06/2015 Elsevier Interactive Patient Education  2018 [employer].     IF you received an x-ray today, you will receive an invoice from [facility]. Please contact [facility] at [phone] with questions or concerns regarding your invoice.   IF you received labwork today, you will receive an invoice from [place]. Please contact [facility] at [phone] with questions or concerns regarding your invoice.   Our billing staff will not be able to assist you with questions regarding bills from these companies.  You will be contacted with the lab results as soon as they are available. The fastest way to get your results is to activate your My Chart account. Instructions are located on the last page of this paperwork. If you have not heard from [place] regarding the results in 2 weeks, please  contact this office.

## 2017-07-27 NOTE — Progress Notes (Signed)
    MRN: [id number] DOB: [birth year]  Subjective:   Jerry Greene is a 57 y.o. male presenting for follow up on atypical chest pain, HL, blood pressure and smoking cessation. Patient had his stress test completed, was negative. Has had elevated blood pressure readings, last one at the cardiologist visit today was 150's systolic. He was advised to obtain blood pressure medication with me today. Denies chest pain, shob, diaphoresis, n/v, abdominal pain. Has made significant dietary modifications. He is having a hard time quitting smoking. Would like to start medical therapy. He is currently smoking ~5 cigarettes per day.   Jerry Greene has a current medication list which includes the following prescription(s): atorvastatin. Also has No Known Allergies.  Jerry Greene  has a past medical history of Hyperlipidemia and Hypertension. Denies past surgical history.  Objective:   Vitals: BP (!) 163/92   Pulse 73   Temp 98.1 F (36.7 C) (Oral)   Resp 18   Ht 6' (1.829 m)   Wt 206 lb 12.8 oz (93.8 kg)   SpO2 100%   BMI 28.05 kg/m   BP Readings from Last 3 Encounters:  07/27/17 (!) 163/92  06/15/17 126/84    The 10-year ASCVD risk score Denman George(Goff DC Jr., et al., 2013) is: 17.7%   Values used to calculate the score:     Age: [age] years     Sex: Male     Is Non-Hispanic African American: Yes     Diabetic: No     Tobacco smoker: Yes     Systolic Blood Pressure: 163 mmHg     Is BP treated: No     HDL Cholesterol: 45 mg/dL     Total Cholesterol: 185 mg/dL  Physical Exam  Constitutional: He is oriented to person, place, and time. He appears well-developed and well-nourished.  HENT:  Mouth/Throat: Oropharynx is clear and moist.  Cardiovascular: Normal rate, regular rhythm and intact distal pulses. Exam reveals no gallop and no friction rub.  No murmur heard. Pulmonary/Chest: Effort normal. No respiratory distress. He has no wheezes. He has no rales.  Neurological: He is alert and oriented to person, place,  and time.  Psychiatric: He has a normal mood and affect.   Assessment and Plan :   Atypical chest pain  Essential hypertension  Elevated blood pressure reading  Tobacco use disorder  Improved, keep follow up with cardiology as requested. Start amlodipine for HTN. Will have patient start Nicoderm due to HTN and known risk of elevated blood pressure with Wellbutrin. Will follow up in 4 weeks for BP, check on smoking cessation then too. Labs will be rechecked in 6 months.   Wallis BambergMario Mintie Witherington, PA-C [facility] [phone] 07/27/2017 3:57 PM

## 2017-08-01 ENCOUNTER — Telehealth: Payer: Self-pay | Admitting: *Deleted

## 2017-08-01 NOTE — Telephone Encounter (Signed)
Per pt's chart, he had a cardiologist visit and myoview stress test 07-27-17. Pt saw urgent care as well 3-1 and in the note it's documented stress test normal but there are no reports in Epic.  I called pt to discuss the need for the report of the myoview before the PV 3-22 that's scheduled.   Pt states he has copies of the reports and he will bring them by the [facility] before the PV. I instructed him to bring the reports to the 4th floor of the [facility] and let the receptionist know [facility]  I gave him the address of our center.   We discussed PV date and colon date.    Hilda LiasMarie PV

## 2017-08-10 ENCOUNTER — Telehealth: Payer: Self-pay

## 2017-08-10 NOTE — Telephone Encounter (Signed)
PA Approved through 08/08/2018 for EQ NICOTINE DIS 14MG /24

## 2017-08-17 ENCOUNTER — Encounter: Payer: Self-pay | Admitting: Gastroenterology

## 2017-08-17 ENCOUNTER — Ambulatory Visit (AMBULATORY_SURGERY_CENTER): Payer: Self-pay | Admitting: *Deleted

## 2017-08-17 ENCOUNTER — Other Ambulatory Visit: Payer: Self-pay

## 2017-08-17 VITALS — Ht 72.0 in | Wt 207.2 lb

## 2017-08-17 DIAGNOSIS — Z1211 Encounter for screening for malignant neoplasm of colon: Secondary | ICD-10-CM

## 2017-08-17 MED ORDER — NA SULFATE-K SULFATE-MG SULF 17.5-3.13-1.6 GM/177ML PO SOLN: 1.0000 | Freq: Once | ORAL | 0 refills | Status: AC

## 2017-08-17 NOTE — Progress Notes (Signed)
Denies allergies to eggs or soy products. Denies complications with sedation or anesthesia. Denies O2 use. Denies use of diet or weight loss medications.  Emmi instructions given for colonoscopy.  

## 2017-08-24 ENCOUNTER — Ambulatory Visit: Payer: 59 | Admitting: Urgent Care

## 2017-08-31 ENCOUNTER — Encounter: Payer: 59 | Admitting: Gastroenterology

## 2018-04-29 ENCOUNTER — Other Ambulatory Visit: Payer: Self-pay | Admitting: Urgent Care

## 2018-06-17 ENCOUNTER — Other Ambulatory Visit: Payer: Self-pay | Admitting: Family Medicine

## 2018-06-17 MED ORDER — ATORVASTATIN CALCIUM 20 MG PO TABS: 20.0000 mg | ORAL_TABLET | Freq: Every day | ORAL | 0 refills | Status: DC

## 2018-06-17 NOTE — Telephone Encounter (Signed)
Copied from CRM [id number]. Topic: Quick Communication - Rx Refill/Question >> Jun 17, 2018  4:35 PM Jens Som A wrote: Medication: atorvastatin (LIPITOR) 20 MG tablet [[id number]] Appt scheduled 07/19/18  Has the patient contacted their pharmacy? Yes  (Agent: If no, request that the patient contact the pharmacy for the refill.) (Agent: If yes, when and what did the pharmacy advise?)  Preferred Pharmacy (with phone number or street name): [facility] - [place], [place] - [address] [phone] (Phone) [phone] (Fax)    Agent: Please be advised that RX refills may take up to 3 business days. We ask that you follow-up with your pharmacy.

## 2018-06-17 NOTE — Telephone Encounter (Signed)
Courtesy refill until appointment 07/19/18.

## 2018-06-23 ENCOUNTER — Other Ambulatory Visit: Payer: Self-pay | Admitting: Family Medicine

## 2018-06-24 NOTE — Telephone Encounter (Signed)
Courtesy refill 30 day. Pt has appt   07/19/2018

## 2018-07-19 ENCOUNTER — Encounter: Payer: 59 | Admitting: Family Medicine

## 2018-08-02 ENCOUNTER — Encounter: Payer: 59 | Admitting: Family Medicine

## 2018-08-09 ENCOUNTER — Telehealth: Payer: Self-pay | Admitting: Family Medicine

## 2018-08-09 NOTE — Telephone Encounter (Signed)
Copied from CRM [id number]. Topic: Quick Communication - Rx Refill/Question >> Aug 09, 2018 12:42 PM Burchel, Abbi R wrote: Medication: amLODipine (NORVASC) 5 MG tablet  Pt requesting refill until appt on 10/11/2018.   Preferred Pharmacy: [facility] [place], [place] - [address] [place] [address] Phone: [phone] Fax: [phone]    Pt was advised that RX refills may take up to 3 business days. We ask that you follow-up with your pharmacy.

## 2018-08-13 ENCOUNTER — Other Ambulatory Visit: Payer: Self-pay

## 2018-08-13 MED ORDER — AMLODIPINE BESYLATE 5 MG PO TABS: ORAL_TABLET | ORAL | 0 refills | Status: DC

## 2018-08-13 NOTE — Telephone Encounter (Signed)
Sent in RX for enough to get to apt will not refill anymore past that.

## 2018-08-30 ENCOUNTER — Encounter: Payer: 59 | Admitting: Family Medicine

## 2018-10-11 ENCOUNTER — Encounter: Payer: 59 | Admitting: Family Medicine

## 2018-11-15 ENCOUNTER — Ambulatory Visit: Payer: 59 | Admitting: Registered Nurse

## 2018-11-15 ENCOUNTER — Encounter: Payer: Self-pay | Admitting: Registered Nurse

## 2018-11-15 ENCOUNTER — Other Ambulatory Visit: Payer: Self-pay

## 2018-11-15 VITALS — BP 145/80 | HR 77 | Temp 98.5°F | Resp 16 | Ht 72.44 in | Wt 222.0 lb

## 2018-11-15 DIAGNOSIS — I10 Essential (primary) hypertension: Secondary | ICD-10-CM

## 2018-11-15 DIAGNOSIS — Z130 Encounter for screening for diseases of the blood and blood-forming organs and certain disorders involving the immune mechanism: Secondary | ICD-10-CM

## 2018-11-15 DIAGNOSIS — Z1329 Encounter for screening for other suspected endocrine disorder: Secondary | ICD-10-CM | POA: Diagnosis not present

## 2018-11-15 DIAGNOSIS — E785 Hyperlipidemia, unspecified: Secondary | ICD-10-CM | POA: Insufficient documentation

## 2018-11-15 DIAGNOSIS — Z13228 Encounter for screening for other metabolic disorders: Secondary | ICD-10-CM

## 2018-11-15 DIAGNOSIS — Z13220 Encounter for screening for lipoid disorders: Secondary | ICD-10-CM

## 2018-11-15 MED ORDER — LISINOPRIL 10 MG PO TABS: 10.0000 mg | ORAL_TABLET | Freq: Every day | ORAL | 3 refills | Status: DC

## 2018-11-15 NOTE — Patient Instructions (Signed)
° ° ° °  If you have lab work done today you will be contacted with your lab results within the next 2 weeks.  If you have not heard from us then please contact us. The fastest way to get your results is to register for My Chart. ° ° °IF you received an x-ray today, you will receive an invoice from [facility]. Please contact [facility] at [phone] with questions or concerns regarding your invoice.  ° °IF you received labwork today, you will receive an invoice from [facility]. Please contact [facility] at [phone] with questions or concerns regarding your invoice.  ° °Our billing staff will not be able to assist you with questions regarding bills from these companies. ° °You will be contacted with the lab results as soon as they are available. The fastest way to get your results is to activate your My Chart account. Instructions are located on the last page of this paperwork. If you have not heard from us regarding the results in 2 weeks, please contact this office. °  ° ° ° °

## 2018-11-15 NOTE — Progress Notes (Signed)
Established Patient Office Visit  Subjective:  Patient ID: Jerry Greene Aron, male    DOB: May 30, 1960  Age: 58 y.o. MRN: [id number]  CC:  Chief Complaint  Patient presents with  . Transitions Of Care    needs a pcp to manage medications and HTN    HPI Jerry Greene Klute presents for transfer of care visit and med refills  HTN: Managed with Lisinopril 5mg  PO qd. His BP has been running high and it is high in office today.  HLD: Trialed Atorvastatin, failed d/t muscle aches. Will check today and follow up as warranted.  Otherwise, no major or urgent complaints. Pt states he has had an issue with brittle nails - will check TSH and other labs today. He does work with his hands, as such, could simply be impact from work.  Past Medical History:  Diagnosis Date  . Hyperlipidemia   . Hypertension     History reviewed. No pertinent surgical history.  Family History  Problem Relation Age of Onset  . Diabetes Mother   . Diabetes Sister   . Diabetes Brother   . Colon cancer Neg Hx   . Esophageal cancer Neg Hx   . Rectal cancer Neg Hx   . Stomach cancer Neg Hx     Social History   Socioeconomic History  . Marital status: Married    Spouse name: Not on file  . Number of children: 4  . Years of education: Not on file  . Highest education level: Not on file  Occupational History  . Not on file  Social Needs  . Financial resource strain: Not hard at all  . Food insecurity    Worry: Never true    Inability: Never true  . Transportation needs    Medical: No    Non-medical: No  Tobacco Use  . Smoking status: Current Every Day Smoker    Packs/day: 0.65    Years: 40.00    Pack years: 26.00    Types: Cigarettes  . Smokeless tobacco: Never Used  . Tobacco comment: approxiamately 5-6 cigs/day  Substance and Sexual Activity  . Alcohol use: Yes    Alcohol/week: 3.0 standard drinks    Types: 3 Cans of beer per week  . Drug use: No  . Sexual activity: Not on file  Lifestyle  .  Physical activity    Days per week: 0 days    Minutes per session: 0 min  . Stress: Not at all  Relationships  . Social Musicianconnections    Talks on phone: Three times a week    Gets together: Twice a week    Attends religious service: Never    Active member of club or organization: No    Attends meetings of clubs or organizations: Never    Relationship status: Married  . Intimate partner violence    Fear of current or ex partner: No    Emotionally abused: No    Physically abused: No    Forced sexual activity: No  Other Topics Concern  . Not on file  Social History Narrative  . Not on file    Outpatient Medications Prior to Visit  Medication Sig Dispense Refill  . amLODipine (NORVASC) 5 MG tablet     . nicotine (NICODERM CQ) 14 mg/24hr patch Place 1 patch (14 mg total) onto the skin daily. 42 patch 0  . amLODipine (NORVASC) 5 MG tablet TAKE 1 TABLET BY MOUTH ONCE DAILY **NO MORE REFILLS** 75 tablet 0  . atorvastatin (LIPITOR)  20 MG tablet Take 1 tablet (20 mg total) by mouth daily. Must keep appointment 07/18/18 for additional refills. 30 tablet 0   No facility-administered medications prior to visit.     No Known Allergies  ROS Review of Systems  Constitutional: Negative.   HENT: Negative.   Eyes: Negative.   Respiratory: Negative.   Cardiovascular: Negative.   Gastrointestinal: Negative.   Endocrine: Negative.   Genitourinary: Negative.   Musculoskeletal: Negative.   Skin: Negative.   Allergic/Immunologic: Negative.   Neurological: Negative.   Hematological: Negative.   Psychiatric/Behavioral: Negative.       Objective:    Physical Exam  Constitutional: He is oriented to person, place, and time. He appears well-developed and well-nourished. No distress.  Cardiovascular: Normal rate, regular rhythm, normal heart sounds and intact distal pulses. Exam reveals no gallop and no friction rub.  No murmur heard. Pulmonary/Chest: Effort normal and breath sounds normal.  No respiratory distress. He has no wheezes. He has no rales. He exhibits no tenderness.  Abdominal: Soft. Bowel sounds are normal. He exhibits no distension and no mass. There is no abdominal tenderness. There is no rebound and no guarding.  Neurological: He is alert and oriented to person, place, and time.  Skin: Skin is warm and dry. No rash noted. He is not diaphoretic. No erythema. No pallor.  Psychiatric: He has a normal mood and affect. His behavior is normal. Judgment and thought content normal.  Nursing note and vitals reviewed.   BP (!) 145/80   Pulse 77   Temp 98.5 F (36.9 C) (Oral)   Resp 16   Ht 6' 0.44" (1.84 m)   Wt 222 lb (100.7 kg)   SpO2 98%   BMI 29.74 kg/m  Wt Readings from Last 3 Encounters:  11/15/18 222 lb (100.7 kg)  08/17/17 207 lb 3.2 oz (94 kg)  07/27/17 206 lb 12.8 oz (93.8 kg)     There are no preventive care reminders to display for this patient.  There are no preventive care reminders to display for this patient.  Lab Results  Component Value Date   TSH 0.918 06/15/2017   Lab Results  Component Value Date   WBC 5.9 06/15/2017   HGB 14.7 06/15/2017   HCT 43.2 06/15/2017   MCV 79 06/15/2017   PLT 182 06/15/2017   Lab Results  Component Value Date   NA 141 06/15/2017   K 4.5 06/15/2017   CO2 21 06/15/2017   GLUCOSE 109 (H) 06/15/2017   BUN 12 06/15/2017   CREATININE 1.43 (H) 06/15/2017   BILITOT 0.3 06/15/2017   ALKPHOS 70 06/15/2017   AST 10 06/15/2017   ALT 8 06/15/2017   PROT 7.3 06/15/2017   ALBUMIN 4.8 06/15/2017   CALCIUM 10.0 06/15/2017   Lab Results  Component Value Date   CHOL 185 06/15/2017   Lab Results  Component Value Date   HDL 45 06/15/2017   Lab Results  Component Value Date   LDLCALC 126 (H) 06/15/2017   Lab Results  Component Value Date   TRIG 68 06/15/2017   Lab Results  Component Value Date   CHOLHDL 4.1 06/15/2017   Lab Results  Component Value Date   HGBA1C 6.0 (H) 06/15/2017       Assessment & Plan:   Problem List Items Addressed This Visit      Cardiovascular and Mediastinum   Essential hypertension   Relevant Medications   amLODipine (NORVASC) 5 MG tablet   lisinopril (ZESTRIL) 10 MG  tablet     Other   Hyperlipidemia   Relevant Medications   amLODipine (NORVASC) 5 MG tablet   lisinopril (ZESTRIL) 10 MG tablet    Other Visit Diagnoses    Screening for endocrine, metabolic and immunity disorder    -  Primary   Relevant Orders   CBC   Comprehensive metabolic panel   Hemoglobin A1c   TSH   Lipid screening       Relevant Orders   Lipid panel      Meds ordered this encounter  Medications  . lisinopril (ZESTRIL) 10 MG tablet    Sig: Take 1 tablet (10 mg total) by mouth daily.    Dispense:  90 tablet    Refill:  3    Order Specific Question:   Supervising Provider    Answer:   Forrest Moron [id number]    Follow-up: Return in about 6 months (around 05/17/2019).   PLAN:  Start Lisinopril 10mg  PO qd as a dose increase. Return to clinic in 2 weeks for BP check.  Will follow up on labs as warranted.  Return to clinic in 6 mos for follow up.  Patient encouraged to call clinic with any questions, comments, or concerns.   Maximiano Coss, NP

## 2018-11-16 LAB — COMPREHENSIVE METABOLIC PANEL
ALT: 13 IU/L (ref 0–44)
AST: 13 IU/L (ref 0–40)
Albumin/Globulin Ratio: 2 (ref 1.2–2.2)
Albumin: 4.9 g/dL (ref 3.8–4.9)
Alkaline Phosphatase: 69 IU/L (ref 39–117)
BUN/Creatinine Ratio: 8 — ABNORMAL LOW (ref 9–20)
BUN: 12 mg/dL (ref 6–24)
Bilirubin Total: 0.2 mg/dL (ref 0.0–1.2)
CO2: 22 mmol/L (ref 20–29)
Calcium: 10.4 mg/dL — ABNORMAL HIGH (ref 8.7–10.2)
Chloride: 105 mmol/L (ref 96–106)
Creatinine, Ser: 1.51 mg/dL — ABNORMAL HIGH (ref 0.76–1.27)
GFR calc Af Amer: 58 mL/min/{1.73_m2} — ABNORMAL LOW (ref >59)
GFR calc non Af Amer: 51 mL/min/{1.73_m2} — ABNORMAL LOW (ref >59)
Globulin, Total: 2.5 g/dL (ref 1.5–4.5)
Glucose: 84 mg/dL (ref 65–99)
Potassium: 4.2 mmol/L (ref 3.5–5.2)
Sodium: 142 mmol/L (ref 134–144)
Total Protein: 7.4 g/dL (ref 6.0–8.5)

## 2018-11-16 LAB — CBC
Hematocrit: 43.4 % (ref 37.5–51.0)
Hemoglobin: 14.5 g/dL (ref 13.0–17.7)
MCH: 26.2 pg — ABNORMAL LOW (ref 26.6–33.0)
MCHC: 33.4 g/dL (ref 31.5–35.7)
MCV: 79 fL (ref 79–97)
Platelets: 215 10*3/uL (ref 150–450)
RBC: 5.53 x10E6/uL (ref 4.14–5.80)
RDW: 14.8 % (ref 11.6–15.4)
WBC: 6 10*3/uL (ref 3.4–10.8)

## 2018-11-16 LAB — HEMOGLOBIN A1C
Est. average glucose Bld gHb Est-mCnc: 117 mg/dL
Hgb A1c MFr Bld: 5.7 % — ABNORMAL HIGH (ref 4.8–5.6)

## 2018-11-16 LAB — TSH: TSH: 0.955 u[IU]/mL (ref 0.450–4.500)

## 2018-11-16 LAB — LIPID PANEL
Chol/HDL Ratio: 5 ratio (ref 0.0–5.0)
Cholesterol, Total: 195 mg/dL (ref 100–199)
HDL: 39 mg/dL — ABNORMAL LOW (ref >39)
LDL Calculated: 133 mg/dL — ABNORMAL HIGH (ref 0–99)
Triglycerides: 114 mg/dL (ref 0–149)
VLDL Cholesterol Cal: 23 mg/dL (ref 5–40)

## 2018-11-18 ENCOUNTER — Telehealth: Payer: Self-pay | Admitting: Registered Nurse

## 2018-11-18 NOTE — Telephone Encounter (Signed)
Called patient to discuss lab results, no answer, left VM asking to call back.  Kidney function mildly diminished - at this point it is important to control his BP to ensure we don't damage his kidneys. Other labs looking ok. Emphasize importance of returning for 2 week BP recheck.   No urgent concerns at this time  Kathrin Ruddy, NP

## 2018-11-19 ENCOUNTER — Telehealth: Payer: Self-pay | Admitting: Registered Nurse

## 2018-11-19 ENCOUNTER — Other Ambulatory Visit: Payer: Self-pay | Admitting: Registered Nurse

## 2018-11-19 DIAGNOSIS — I10 Essential (primary) hypertension: Secondary | ICD-10-CM

## 2018-11-19 MED ORDER — AMLODIPINE BESYLATE 10 MG PO TABS: 10.0000 mg | ORAL_TABLET | Freq: Every day | ORAL | 1 refills | Status: DC

## 2018-11-19 NOTE — Telephone Encounter (Signed)
Medication Refill - Medication: amLODipine (NORVASC) 10 MG tablet (Patient stated wrong medication was sent to pharmacy.)   Has the patient contacted their pharmacy? Yes (Agent: If no, request that the patient contact the pharmacy for the refill.) (Agent: If yes, when and what did the pharmacy advise?)Contact PCP  Preferred Pharmacy (with phone number or street name):  [place], [place] [phone] (Phone) [phone] (Fax)     Agent: Please be advised that RX refills may take up to 3 business days. We ask that you follow-up with your pharmacy.

## 2018-11-21 NOTE — Telephone Encounter (Signed)
Amlodipine 10 g #90 with 1 refill sent to [facility] on 11/19/2018.  Spoke with pt via phone and he advises he did pickup medication. Dgaddy, CMA

## 2018-11-29 ENCOUNTER — Ambulatory Visit: Payer: 59

## 2018-11-29 ENCOUNTER — Ambulatory Visit: Payer: 59 | Admitting: Registered Nurse

## 2018-12-04 ENCOUNTER — Other Ambulatory Visit: Payer: Self-pay

## 2018-12-04 ENCOUNTER — Ambulatory Visit (INDEPENDENT_AMBULATORY_CARE_PROVIDER_SITE_OTHER): Payer: 59 | Admitting: Registered Nurse

## 2018-12-04 ENCOUNTER — Other Ambulatory Visit: Payer: Self-pay | Admitting: Registered Nurse

## 2018-12-04 ENCOUNTER — Telehealth: Payer: Self-pay

## 2018-12-04 VITALS — BP 156/96

## 2018-12-04 DIAGNOSIS — I10 Essential (primary) hypertension: Secondary | ICD-10-CM

## 2018-12-04 DIAGNOSIS — G4700 Insomnia, unspecified: Secondary | ICD-10-CM

## 2018-12-04 MED ORDER — TRAZODONE HCL 50 MG PO TABS: 25.0000 mg | ORAL_TABLET | Freq: Every evening | ORAL | 0 refills | Status: DC | PRN

## 2018-12-04 NOTE — Progress Notes (Signed)
Pt seen in office for 2 week BP check - elevated. Will double dose of lisinopril from 10mg  to 20mg  and check in around 6 weeks.   Pt also endorses some insomnia, has taken ambien in the past with good effect Would prefer to try trazodone 50mg  PO qpm, pt in agreement with plan. 6 week supply to be given, will check in with BP check  Kathrin Ruddy, NP

## 2018-12-04 NOTE — Progress Notes (Signed)
Pt came to get bp chk'd and to request ambien refill for sleep aid, message was sent to provider

## 2018-12-04 NOTE — Telephone Encounter (Signed)
Pt is requesting ambien for sleep

## 2018-12-04 NOTE — Telephone Encounter (Signed)
Dr. Orland Mustard advised Pt of increasing his lisinopril (ZESTRIL) 10 MG tablet To 20MG  and Pt wants to know with the 10mg  tablets he already has does he double it and take 2 at once or take 1 in the morning and 1 later in the day/ please advise

## 2018-12-12 ENCOUNTER — Other Ambulatory Visit: Payer: Self-pay | Admitting: *Deleted

## 2018-12-12 DIAGNOSIS — I10 Essential (primary) hypertension: Secondary | ICD-10-CM

## 2018-12-12 MED ORDER — LISINOPRIL 20 MG PO TABS: 20.0000 mg | ORAL_TABLET | Freq: Every day | ORAL | 3 refills | Status: DC

## 2018-12-13 NOTE — Telephone Encounter (Signed)
Spoke with pt and clarified medication has been sent to pharmacy

## 2018-12-13 NOTE — Telephone Encounter (Signed)
Pt calling about Lisinopril.  States that the script that went in was for 10mg , but that PCP had increased that to 20mg  and he needs to know what dose he is supposed to be taking.

## 2019-01-17 ENCOUNTER — Ambulatory Visit (INDEPENDENT_AMBULATORY_CARE_PROVIDER_SITE_OTHER): Payer: 59 | Admitting: Registered Nurse

## 2019-01-17 ENCOUNTER — Other Ambulatory Visit: Payer: Self-pay | Admitting: Registered Nurse

## 2019-01-17 ENCOUNTER — Other Ambulatory Visit: Payer: Self-pay

## 2019-01-17 VITALS — BP 132/82

## 2019-01-17 DIAGNOSIS — I10 Essential (primary) hypertension: Secondary | ICD-10-CM

## 2019-01-17 NOTE — Patient Instructions (Addendum)
B/P was good today he states he has been checking pressure at home and the results have been good also. I asked pt about the Trazodone for sleep and pt states the medication is not working. He states it makes it hard for him to get up in the morning. He states he still is up at night also. He states he would like to try Ambien.

## 2019-04-30 ENCOUNTER — Other Ambulatory Visit: Payer: Self-pay

## 2019-04-30 DIAGNOSIS — Z20822 Contact with and (suspected) exposure to covid-19: Secondary | ICD-10-CM

## 2019-05-02 ENCOUNTER — Encounter: Payer: Self-pay | Admitting: Adult Health Nurse Practitioner

## 2019-05-02 ENCOUNTER — Telehealth (INDEPENDENT_AMBULATORY_CARE_PROVIDER_SITE_OTHER): Payer: 59 | Admitting: Adult Health Nurse Practitioner

## 2019-05-02 ENCOUNTER — Other Ambulatory Visit: Payer: Self-pay

## 2019-05-02 ENCOUNTER — Ambulatory Visit: Payer: Self-pay

## 2019-05-02 ENCOUNTER — Telehealth: Payer: Self-pay | Admitting: Registered Nurse

## 2019-05-02 VITALS — Ht 72.0 in | Wt 210.0 lb

## 2019-05-02 DIAGNOSIS — U071 COVID-19: Secondary | ICD-10-CM

## 2019-05-02 LAB — NOVEL CORONAVIRUS, NAA: SARS-CoV-2, NAA: DETECTED — AB

## 2019-05-02 MED ORDER — ALBUTEROL SULFATE HFA 108 (90 BASE) MCG/ACT IN AERS: 2.0000 | INHALATION_SPRAY | Freq: Four times a day (QID) | RESPIRATORY_TRACT | 0 refills | Status: DC | PRN

## 2019-05-02 NOTE — Telephone Encounter (Signed)
Open in error.  See triage note.

## 2019-05-02 NOTE — Telephone Encounter (Signed)
Patient called with his wife stating that he has just tested positive for COVID-19 and he has a cough that is non stop. It is not productive but is rattling per his wife. He denies SOB and chest pain.  She states he C/O rib pain from the harsh cough. They have tried OTC medication which is not working. He is unable to check temperature but he has chills and sweats. He had diarrhea. Worst symptom is the cough. Care advice for cough read to wife.  She verbalized understanding. Call transferred to office for scheduling.  Reason for Disposition . [1] Continuous (nonstop) coughing interferes with work or school AND [2] no improvement using cough treatment per Care Advice  Answer Assessment - Initial Assessment Questions 1. ONSET: "When did the cough begin?"      With COVID 2. SEVERITY: "How bad is the cough today?"     constant 3. RESPIRATORY DISTRESS: "Describe your breathing."      No distress 4. FEVER: "Do you have a fever?" If so, ask: "What is your temperature, how was it measured, and when did it start?"    Initially  5. SPUTUM: "Describe the color of your sputum" (clear, white, yellow, green)     nothing 6. HEMOPTYSIS: "Are you coughing up any blood?" If so ask: "How much?" (flecks, streaks, tablespoons, etc.)    none 7. CARDIAC HISTORY: "Do you have any history of heart disease?" (e.g., heart attack, congestive heart failure)     Heart valve condition 8. LUNG HISTORY: "Do you have any history of lung disease?"  (e.g., pulmonary embolus, asthma, emphysema)     Smoker  9. PE RISK FACTORS: "Do you have a history of blood clots?" (or: recent major surgery, recent prolonged travel, bedridden)     no 10. OTHER SYMPTOMS: "Do you have any other symptoms?" (e.g., runny nose, wheezing, chest pain)       No chest pain rib pain from cough 11. PREGNANCY: "Is there any chance you are pregnant?" "When was your last menstrual period?"       N/a 12. TRAVEL: "Have you traveled out of the country in the  last month?" (e.g., travel history, exposures)       N/A Covid -19 positive  Protocols used: [place]

## 2019-05-04 ENCOUNTER — Telehealth: Payer: Self-pay | Admitting: Unknown Physician Specialty

## 2019-05-04 NOTE — Telephone Encounter (Signed)
Note in error.

## 2019-05-15 ENCOUNTER — Encounter: Payer: Self-pay | Admitting: Adult Health Nurse Practitioner

## 2019-05-15 DIAGNOSIS — U071 COVID-19: Secondary | ICD-10-CM | POA: Insufficient documentation

## 2019-05-15 HISTORY — DX: COVID-19: U07.1

## 2019-05-15 NOTE — Progress Notes (Signed)
Telemedicine Encounter- SOAP NOTE Established Patient  This telephone encounter was conducted with the patient's (or proxy's) verbal consent via audio telecommunications: yes/no: Yes Patient was instructed to have this encounter in a suitably private space; and to only have persons present to whom they give permission to participate. In addition, patient identity was confirmed by use of name plus two identifiers (DOB and address).  I discussed the limitations, risks, security and privacy concerns of performing an evaluation and management service by telephone and the availability of in person appointments. I also discussed with the patient that there may be a patient responsible charge related to this service. The patient expressed understanding and agreed to proceed.  I spent a total of TIME; 0 MIN TO 60 MIN: 20 minutes talking with the patient or their proxy.  Chief Complaint  Patient presents with   Cough    Pt stated ---doing much better but still having a little cough clear mucus    Subjective   Jerry Greene is a 58 y.o. established patient. Telephone visit today for positive Covid with persistent cough.   HPI   Patient calls for  Positive Covid noted on 12/2.  He is feeling somewhat better but still has cough and clear mucus.  He is fatigued.  We discussed further quarantine and isolation guidelines form the CDC.  Denies loss of taste or smell. Mild SOB.   Patient Active Problem List   Diagnosis Date Noted   COVID-19 virus infection 05/15/2019   Essential hypertension 11/15/2018   Hyperlipidemia 11/15/2018    Past Medical History:  Diagnosis Date   COVID-19 virus infection 05/15/2019   Hyperlipidemia    Hypertension     Current Outpatient Medications  Medication Sig Dispense Refill   amLODipine (NORVASC) 10 MG tablet Take 1 tablet (10 mg total) by mouth daily. 90 tablet 1   lisinopril (ZESTRIL) 20 MG tablet Take 1 tablet (20 mg total) by mouth daily. 90  tablet 3   nicotine (NICODERM CQ) 14 mg/24hr patch Place 1 patch (14 mg total) onto the skin daily. 42 patch 0   traZODone (DESYREL) 50 MG tablet Take 0.5-1 tablets (25-50 mg total) by mouth at bedtime as needed for sleep. 90 tablet 0   albuterol (VENTOLIN HFA) 108 (90 Base) MCG/ACT inhaler Inhale 2 puffs into the lungs every 6 (six) hours as needed for wheezing or shortness of breath. 6.7 g 0   No current facility-administered medications for this visit.    No Known Allergies  Social History   Socioeconomic History   Marital status: Married    Spouse name: Not on file   Number of children: 4   Years of education: Not on file   Highest education level: Not on file  Occupational History   Not on file  Tobacco Use   Smoking status: Current Every Day Smoker    Packs/day: 0.65    Years: 40.00    Pack years: 26.00    Types: Cigarettes   Smokeless tobacco: Never Used   Tobacco comment: approxiamately 5-6 cigs/day  Substance and Sexual Activity   Alcohol use: Yes    Alcohol/week: 3.0 standard drinks    Types: 3 Cans of beer per week   Drug use: No   Sexual activity: Not on file  Other Topics Concern   Not on file  Social History Narrative   Not on file   Social Determinants of Health   Financial Resource Strain: Low Risk    Difficulty of  Paying Living Expenses: Not hard at all  Food Insecurity: No Food Insecurity   Worried About [place] in the Last Year: Never true   Ran Out of Food in the Last Year: Never true  Transportation Needs: No Transportation Needs   Lack of Transportation (Medical): No   Lack of Transportation (Non-Medical): No  Physical Activity: Inactive   Days of Exercise per Week: 0 days   Minutes of Exercise per Session: 0 min  Stress: No Stress Concern Present   Feeling of Stress : Not at all  Social Connections: Somewhat Isolated   Frequency of Communication with Friends and Family: Three times a week   Frequency  of Social Gatherings with Friends and Family: Twice a week   Attends Religious Services: Never   Marine scientist or Organizations: No   Attends Music therapist: Never   Marital Status: Married  Human resources officer Violence: Not At Risk   Fear of Current or Ex-Partner: No   Emotionally Abused: No   Physically Abused: No   Sexually Abused: No    Review of Systems  Constitutional: Positive for chills, diaphoresis and malaise/fatigue.  HENT: Positive for congestion. Negative for sore throat.   Eyes: Negative.   Respiratory: Positive for cough and shortness of breath.   Cardiovascular: Negative for chest pain and leg swelling.  Gastrointestinal: Negative.   Musculoskeletal: Negative for falls and myalgias.  Endo/Heme/Allergies: Negative.     Objective    General appearance: oriented to person, place, and time. Mental Status: normal mood, behavior, speech, dress, motor activity, and thought processes.   Vitals as reported by the patient: Today's Vitals   05/02/19 1526  Weight: 210 lb (95.3 kg)  Height: 6' (1.829 m)    Jerry Greene was seen today for cough.  Diagnoses and all orders for this visit:  COVID-19 virus infection  Other orders -     albuterol (VENTOLIN HFA) 108 (90 Base) MCG/ACT inhaler; Inhale 2 puffs into the lungs every 6 (six) hours as needed for wheezing or shortness of breath.     I discussed the assessment and treatment plan with the patient. The patient was provided an opportunity to ask questions and all were answered. The patient agreed with the plan and demonstrated an understanding of the instructions.   The patient was advised to call back or seek an in-person evaluation if the symptoms worsen or if the condition fails to improve as anticipated.  I provided 20 minutes of non-face-to-face time during this encounter.  Glyn Ade, NP  Primary Care at [facility]

## 2019-06-09 ENCOUNTER — Telehealth: Payer: Self-pay | Admitting: Registered Nurse

## 2019-06-09 ENCOUNTER — Other Ambulatory Visit: Payer: Self-pay | Admitting: Emergency Medicine

## 2019-06-09 DIAGNOSIS — I10 Essential (primary) hypertension: Secondary | ICD-10-CM

## 2019-06-09 MED ORDER — AMLODIPINE BESYLATE 10 MG PO TABS: 10.0000 mg | ORAL_TABLET | Freq: Every day | ORAL | 0 refills | Status: DC

## 2019-06-09 NOTE — Telephone Encounter (Signed)
Refill on bp medication requested  amLODipine (NORVASC) 10 MG tablet [[id number]]   Pt has no medication left   [facility] [address] [place], [place] - [address]  [address], [place] [address]  Phone:  [phone] Fax:  [phone]

## 2019-06-09 NOTE — Telephone Encounter (Signed)
Pt scheduled  

## 2019-06-09 NOTE — Telephone Encounter (Signed)
Rx for amlodipine has sent for 30 day but office is required for any further refill.  Scheduling Pool: Please schedule appt with in the 30 days from today. Tele-med visit is ok to schedule as well

## 2019-06-20 ENCOUNTER — Encounter: Payer: Self-pay | Admitting: Registered Nurse

## 2019-06-20 ENCOUNTER — Telehealth: Payer: Self-pay | Admitting: Registered Nurse

## 2019-06-20 ENCOUNTER — Other Ambulatory Visit: Payer: Self-pay

## 2019-06-20 ENCOUNTER — Telehealth (INDEPENDENT_AMBULATORY_CARE_PROVIDER_SITE_OTHER): Payer: 59 | Admitting: Registered Nurse

## 2019-06-20 DIAGNOSIS — I10 Essential (primary) hypertension: Secondary | ICD-10-CM | POA: Diagnosis not present

## 2019-06-20 MED ORDER — LOSARTAN POTASSIUM 50 MG PO TABS: 50.0000 mg | ORAL_TABLET | Freq: Every day | ORAL | 1 refills | Status: DC

## 2019-06-20 MED ORDER — SILDENAFIL CITRATE 50 MG PO TABS: 50.0000 mg | ORAL_TABLET | Freq: Every day | ORAL | 0 refills | Status: DC | PRN

## 2019-06-20 MED ORDER — AMLODIPINE BESYLATE 10 MG PO TABS: 10.0000 mg | ORAL_TABLET | Freq: Every day | ORAL | 1 refills | Status: DC

## 2019-06-20 NOTE — Progress Notes (Signed)
LVMTCB and schedule CPE

## 2019-06-20 NOTE — Progress Notes (Signed)
Telemedicine Encounter- SOAP NOTE Established Patient  This telephone encounter was conducted with the patient's (or proxy's) verbal consent via audio telecommunications: yes  Patient was instructed to have this encounter in a suitably private space; and to only have persons present to whom they give permission to participate. In addition, patient identity was confirmed by use of name plus two identifiers (DOB and address).  I discussed the limitations, risks, security and privacy concerns of performing an evaluation and management service by telephone and the availability of in person appointments. I also discussed with the patient that there may be a patient responsible charge related to this service. The patient expressed understanding and agreed to proceed.  I spent a total of 16 minutes talking with the patient or their proxy.  Chief Complaint  Patient presents with  . Medication Refill    amlodipine    Subjective   Jerry Greene is a 59 y.o. established patient. Telephone visit today for med refills  HPI Has felt overall well since last visit. Experienced COVID infection. Some lingering fatigue - gets fatigued more easily on exertion.  Has been checking home BP - typically around 140/90. Reports good medication compliance. No CV symptoms - denies headache, visual changes, shob, doe, chest pain, dependent edema.  Otherwise, notes that he is having difficulty maintaining erections when intimate with his partner. This has not been a problem for him in the past. He is interested in addressing this concern with medication.   Patient Active Problem List   Diagnosis Date Noted  . COVID-19 virus infection 05/15/2019  . Essential hypertension 11/15/2018  . Hyperlipidemia 11/15/2018    Past Medical History:  Diagnosis Date  . COVID-19 virus infection 05/15/2019  . Hyperlipidemia   . Hypertension     Current Outpatient Medications  Medication Sig Dispense Refill  .  albuterol (VENTOLIN HFA) 108 (90 Base) MCG/ACT inhaler Inhale 2 puffs into the lungs every 6 (six) hours as needed for wheezing or shortness of breath. (Patient not taking: Reported on 06/20/2019) 6.7 g 0  . amLODipine (NORVASC) 10 MG tablet Take 1 tablet (10 mg total) by mouth daily. 90 tablet 1  . lisinopril (ZESTRIL) 20 MG tablet Take 1 tablet (20 mg total) by mouth daily. (Patient not taking: Reported on 06/20/2019) 90 tablet 3  . losartan (COZAAR) 50 MG tablet Take 1 tablet (50 mg total) by mouth daily. 90 tablet 1  . nicotine (NICODERM CQ) 14 mg/24hr patch Place 1 patch (14 mg total) onto the skin daily. 42 patch 0  . sildenafil (VIAGRA) 50 MG tablet Take 1 tablet (50 mg total) by mouth daily as needed for erectile dysfunction. 30 tablet 0  . traZODone (DESYREL) 50 MG tablet Take 0.5-1 tablets (25-50 mg total) by mouth at bedtime as needed for sleep. (Patient not taking: Reported on 06/20/2019) 90 tablet 0   No current facility-administered medications for this visit.    No Known Allergies  Social History   Socioeconomic History  . Marital status: Married    Spouse name: Not on file  . Number of children: 4  . Years of education: Not on file  . Highest education level: Not on file  Occupational History  . Not on file  Tobacco Use  . Smoking status: Current Every Day Smoker    Packs/day: 0.65    Years: 40.00    Pack years: 26.00    Types: Cigarettes  . Smokeless tobacco: Never Used  . Tobacco comment: approxiamately 5-6 cigs/day  Substance and Sexual Activity  . Alcohol use: Yes    Alcohol/week: 3.0 standard drinks    Types: 3 Cans of beer per week  . Drug use: No  . Sexual activity: Not on file  Other Topics Concern  . Not on file  Social History Narrative  . Not on file   Social Determinants of Health   Financial Resource Strain: Low Risk   . Difficulty of Paying Living Expenses: Not hard at all  Food Insecurity: No Food Insecurity  . Worried About Brewing technologist in the Last Year: Never true  . Ran Out of Food in the Last Year: Never true  Transportation Needs: No Transportation Needs  . Lack of Transportation (Medical): No  . Lack of Transportation (Non-Medical): No  Physical Activity: Inactive  . Days of Exercise per Week: 0 days  . Minutes of Exercise per Session: 0 min  Stress: No Stress Concern Present  . Feeling of Stress : Not at all  Social Connections: Somewhat Isolated  . Frequency of Communication with Friends and Family: Three times a week  . Frequency of Social Gatherings with Friends and Family: Twice a week  . Attends Religious Services: Never  . Active Member of Clubs or Organizations: No  . Attends Banker Meetings: Never  . Marital Status: Married  Catering manager Violence: Not At Risk  . Fear of Current or Ex-Partner: No  . Emotionally Abused: No  . Physically Abused: No  . Sexually Abused: No    Review of Systems  Constitutional: Negative.   HENT: Negative.   Eyes: Negative.   Respiratory: Negative.   Cardiovascular: Negative.   Gastrointestinal: Negative.   Genitourinary: Negative.   Musculoskeletal: Negative.   Skin: Negative.   Neurological: Negative.   Endo/Heme/Allergies: Negative.   Psychiatric/Behavioral: Negative.   All other systems reviewed and are negative.   Objective   Vitals as reported by the patient: Today's Vitals   06/20/19 0811  Weight: 220 lb (99.8 kg)  Height: 6' (1.829 m)  BP 140/90  Jerry Greene was seen today for medication refill.  Diagnoses and all orders for this visit:  Essential hypertension -     amLODipine (NORVASC) 10 MG tablet; Take 1 tablet (10 mg total) by mouth daily. -     losartan (COZAAR) 50 MG tablet; Take 1 tablet (50 mg total) by mouth daily.  Other orders -     sildenafil (VIAGRA) 50 MG tablet; Take 1 tablet (50 mg total) by mouth daily as needed for erectile dysfunction.   PLAN  Given home readings and past readings in office, will add  Losartan 50mg  PO qd. Continue amlodipine. Will hope to see him in 3-6 mos for physical.  Will try sildenafil 50mg  PO qd PRN for erectile dysfunction. GoodRx coupon sent via text. He will call with any concerns. Reviewed common AEs.   Patient encouraged to call clinic with any questions, comments, or concerns.    I discussed the assessment and treatment plan with the patient. The patient was provided an opportunity to ask questions and all were answered. The patient agreed with the plan and demonstrated an understanding of the instructions.   The patient was advised to call back or seek an in-person evaluation if the symptoms worsen or if the condition fails to improve as anticipated.  I provided 16 minutes of non-face-to-face time during this encounter.  , NP  Primary Care at [facility]

## 2019-09-12 ENCOUNTER — Ambulatory Visit (INDEPENDENT_AMBULATORY_CARE_PROVIDER_SITE_OTHER): Payer: 59 | Admitting: Registered Nurse

## 2019-09-12 ENCOUNTER — Encounter: Payer: Self-pay | Admitting: Registered Nurse

## 2019-09-12 ENCOUNTER — Other Ambulatory Visit: Payer: Self-pay

## 2019-09-12 VITALS — BP 140/84 | HR 104 | Temp 97.9°F | Resp 16 | Ht 73.0 in | Wt 220.0 lb

## 2019-09-12 DIAGNOSIS — Z8639 Personal history of other endocrine, nutritional and metabolic disease: Secondary | ICD-10-CM | POA: Diagnosis not present

## 2019-09-12 DIAGNOSIS — R0789 Other chest pain: Secondary | ICD-10-CM

## 2019-09-12 DIAGNOSIS — R9431 Abnormal electrocardiogram [ECG] [EKG]: Secondary | ICD-10-CM

## 2019-09-12 LAB — COMPREHENSIVE METABOLIC PANEL
ALT: 11 IU/L (ref 0–44)
AST: 14 IU/L (ref 0–40)
Albumin/Globulin Ratio: 1.9 (ref 1.2–2.2)
Albumin: 5 g/dL — ABNORMAL HIGH (ref 3.8–4.9)
Alkaline Phosphatase: 71 IU/L (ref 39–117)
BUN/Creatinine Ratio: 7 — ABNORMAL LOW (ref 9–20)
BUN: 9 mg/dL (ref 6–24)
Bilirubin Total: 0.4 mg/dL (ref 0.0–1.2)
CO2: 21 mmol/L (ref 20–29)
Calcium: 10.2 mg/dL (ref 8.7–10.2)
Chloride: 103 mmol/L (ref 96–106)
Creatinine, Ser: 1.28 mg/dL — ABNORMAL HIGH (ref 0.76–1.27)
GFR calc Af Amer: 71 mL/min/{1.73_m2} (ref >59)
GFR calc non Af Amer: 61 mL/min/{1.73_m2} (ref >59)
Globulin, Total: 2.6 g/dL (ref 1.5–4.5)
Glucose: 89 mg/dL (ref 65–99)
Potassium: 4.2 mmol/L (ref 3.5–5.2)
Sodium: 139 mmol/L (ref 134–144)
Total Protein: 7.6 g/dL (ref 6.0–8.5)

## 2019-09-12 LAB — BRAIN NATRIURETIC PEPTIDE: BNP: <2.5 pg/mL (ref 0.0–100.0)

## 2019-09-12 LAB — POCT GLYCOSYLATED HEMOGLOBIN (HGB A1C): Hemoglobin A1C: 6 % — AB (ref 4.0–5.6)

## 2019-09-12 NOTE — Patient Instructions (Addendum)
Mr Jerry Greene to see you again today.  Your blood pressure was normal Your EKG showed a nonspecific abnormality that will be investigated first with blood work and then a cardiology follow up - we will determine a good time for that appointment next week. Your A1c is holding about steady at 6.0 - keep up the great work on your diet and exercise.  I applaud your efforts to quit smoking - this is no small feat.   No urgent concerns today, but keep a close eye on symptoms. If something arises over the weekend when we're unavailable at [facility], please proceed to Urgent Care or the Emergency Room.  Thank you,  Jari Sportsman, NP   If you have lab work done today you will be contacted with your lab results within the next 2 weeks.  If you have not heard from [place] then please contact us. The fastest way to get your results is to register for My Chart.   IF you received an x-ray today, you will receive an invoice from [facility]. Please contact [facility] Radiology at [phone] with questions or concerns regarding your invoice.   IF you received labwork today, you will receive an invoice from [facility]. Please contact [facility] at [phone] with questions or concerns regarding your invoice.   Our billing staff will not be able to assist you with questions regarding bills from these companies.  You will be contacted with the lab results as soon as they are available. The fastest way to get your results is to activate your My Chart account. Instructions are located on the last page of this paperwork. If you have not heard from [place] regarding the results in 2 weeks, please contact this office.

## 2019-09-16 ENCOUNTER — Encounter: Payer: Self-pay | Admitting: Registered Nurse

## 2019-09-16 NOTE — Progress Notes (Signed)
Acute Office Visit  Subjective:    Patient ID: Jerry Greene, male    DOB: 1960-08-13, 59 y.o.   MRN: [id number]  Chief Complaint  Patient presents with   Chest Pain    pt has had chest pain the last two weeks intermitently, pt is here to be cleared to get the COVID 19 vaccine, pt had one sharp pain in his Lt arm about a week ago, denies slurring speach, denies facial numbness and drooping    HPI Patient is in today for intermittent chest pain nonreproducible Onset two weeks ago Shooting pain in L arm once about 1 week ago No palpitations, visual changes, shob, doe, dependent edema, or claudication.  No neuro symptoms Feeling fairly well overall, but concerned for clearance to get his COVID vaccination   Past Medical History:  Diagnosis Date   COVID-19 virus infection 05/15/2019   Hyperlipidemia    Hypertension     No past surgical history on file.  Family History  Problem Relation Age of Onset   Diabetes Mother    Diabetes Sister    Diabetes Brother    Colon cancer Neg Hx    Esophageal cancer Neg Hx    Rectal cancer Neg Hx    Stomach cancer Neg Hx     Social History   Socioeconomic History   Marital status: Married    Spouse name: Not on file   Number of children: 4   Years of education: Not on file   Highest education level: Not on file  Occupational History   Not on file  Tobacco Use   Smoking status: Current Every Day Smoker    Packs/day: 0.65    Years: 40.00    Pack years: 26.00    Types: Cigarettes   Smokeless tobacco: Never Used   Tobacco comment: approxiamately 5-6 cigs/day  Substance and Sexual Activity   Alcohol use: Yes    Alcohol/week: 3.0 standard drinks    Types: 3 Cans of beer per week   Drug use: No   Sexual activity: Not on file  Other Topics Concern   Not on file  Social History Narrative   Not on file   Social Determinants of Health   Financial Resource Strain: Low Risk    Difficulty of Paying  Living Expenses: Not hard at all  Food Insecurity: No Food Insecurity   Worried About Charity fundraiser in the Last Year: Never true   [place] in the Last Year: Never true  Transportation Needs: No Transportation Needs   Lack of Transportation (Medical): No   Lack of Transportation (Non-Medical): No  Physical Activity: Inactive   Days of Exercise per Week: 0 days   Minutes of Exercise per Session: 0 min  Stress: No Stress Concern Present   Feeling of Stress : Not at all  Social Connections: Somewhat Isolated   Frequency of Communication with Friends and Family: Three times a week   Frequency of Social Gatherings with Friends and Family: Twice a week   Attends Religious Services: Never   Marine scientist or Organizations: No   Attends Music therapist: Never   Marital Status: Married  Human resources officer Violence: Not At Risk   Fear of Current or Ex-Partner: No   Emotionally Abused: No   Physically Abused: No   Sexually Abused: No    Outpatient Medications Prior to Visit  Medication Sig Dispense Refill   amLODipine (NORVASC) 10 MG tablet Take 1 tablet (  10 mg total) by mouth daily. 90 tablet 1   losartan (COZAAR) 50 MG tablet Take 1 tablet (50 mg total) by mouth daily. 90 tablet 1   traZODone (DESYREL) 50 MG tablet Take 0.5-1 tablets (25-50 mg total) by mouth at bedtime as needed for sleep. 90 tablet 0   sildenafil (VIAGRA) 50 MG tablet Take 1 tablet (50 mg total) by mouth daily as needed for erectile dysfunction. 30 tablet 0   albuterol (VENTOLIN HFA) 108 (90 Base) MCG/ACT inhaler Inhale 2 puffs into the lungs every 6 (six) hours as needed for wheezing or shortness of breath. (Patient not taking: Reported on 09/12/2019) 6.7 g 0   lisinopril (ZESTRIL) 20 MG tablet Take 1 tablet (20 mg total) by mouth daily. (Patient not taking: Reported on 06/20/2019) 90 tablet 3   nicotine (NICODERM CQ) 14 mg/24hr patch Place 1 patch (14 mg total) onto  the skin daily. 42 patch 0   No facility-administered medications prior to visit.    No Known Allergies  Review of Systems  Constitutional: Negative.   HENT: Negative.   Eyes: Negative.   Respiratory: Negative.   Cardiovascular: Positive for chest pain. Negative for palpitations and leg swelling.  Gastrointestinal: Negative.   Endocrine: Negative.   Genitourinary: Negative.   Musculoskeletal: Negative.   Skin: Negative.   Allergic/Immunologic: Negative.   Neurological: Negative.   Hematological: Negative.   Psychiatric/Behavioral: Negative.   All other systems reviewed and are negative.      Objective:    Physical Exam Vitals and nursing note reviewed.  Constitutional:      General: He is not in acute distress.    Appearance: He is well-developed and normal weight. He is not ill-appearing, toxic-appearing or diaphoretic.  Cardiovascular:     Heart sounds: Normal heart sounds. Heart sounds not distant. No murmur. No friction rub. No gallop.   Pulmonary:     Effort: Pulmonary effort is normal.     Breath sounds: Normal breath sounds.  Skin:    General: Skin is warm and dry.     Capillary Refill: Capillary refill takes less than 2 seconds.     Coloration: Skin is not cyanotic or pale.     Findings: No ecchymosis, erythema or rash.     Nails: There is no clubbing.  Neurological:     General: No focal deficit present.     Mental Status: He is alert and oriented to person, place, and time.     Cranial Nerves: No cranial nerve deficit.     Motor: No weakness.  Psychiatric:        Mood and Affect: Mood normal. Mood is not anxious.        Behavior: Behavior normal. Behavior is not agitated.     BP 140/84    Pulse (!) 104    Temp 97.9 F (36.6 C) (Temporal)    Resp 16    Ht 6\' 1"  (1.854 m)    Wt 220 lb (99.8 kg)    SpO2 99%    BMI 29.03 kg/m  Wt Readings from Last 3 Encounters:  09/12/19 220 lb (99.8 kg)  06/20/19 220 lb (99.8 kg)  05/02/19 210 lb (95.3 kg)     Health Maintenance Due  Topic Date Due   COVID-19 Vaccine (1) Never done    There are no preventive care reminders to display for this patient.   Lab Results  Component Value Date   TSH 0.955 11/15/2018   Lab Results  Component Value  Date   WBC 6.0 11/15/2018   HGB 14.5 11/15/2018   HCT 43.4 11/15/2018   MCV 79 11/15/2018   PLT 215 11/15/2018   Lab Results  Component Value Date   NA 139 09/12/2019   K 4.2 09/12/2019   CO2 21 09/12/2019   GLUCOSE 89 09/12/2019   BUN 9 09/12/2019   CREATININE 1.28 (H) 09/12/2019   BILITOT 0.4 09/12/2019   ALKPHOS 71 09/12/2019   AST 14 09/12/2019   ALT 11 09/12/2019   PROT 7.6 09/12/2019   ALBUMIN 5.0 (H) 09/12/2019   CALCIUM 10.2 09/12/2019   Lab Results  Component Value Date   CHOL 195 11/15/2018   Lab Results  Component Value Date   HDL 39 (L) 11/15/2018   Lab Results  Component Value Date   LDLCALC 133 (H) 11/15/2018   Lab Results  Component Value Date   TRIG 114 11/15/2018   Lab Results  Component Value Date   CHOLHDL 5.0 11/15/2018   Lab Results  Component Value Date   HGBA1C 6.0 (A) 09/12/2019       Assessment & Plan:   Problem List Items Addressed This Visit    None    Visit Diagnoses    Other chest pain    -  Primary   Relevant Orders   EKG 12-Lead (Completed)   Ambulatory referral to Cardiology   Flattened T wave       Relevant Orders   Brain natriuretic peptide (Completed)   Comprehensive metabolic panel (Completed)   Ambulatory referral to Cardiology   History of elevated glucose       Relevant Orders   POCT glycosylated hemoglobin (Hb A1C) (Completed)       No orders of the defined types were placed in this encounter.  PLAN  Abnormal ekg  Will draw stat labs and place referral to cards for further work up  Pt should be stable to receive COVID vaccine  Patient encouraged to call clinic with any questions, comments, or concerns.  Janeece Agee, NP

## 2019-09-25 ENCOUNTER — Telehealth: Payer: Self-pay | Admitting: Cardiology

## 2019-09-25 NOTE — Telephone Encounter (Signed)
LMTCB

## 2019-09-25 NOTE — Telephone Encounter (Signed)
New message:       Patient calling to see if his wife can come with him for his appointment. Please call back.

## 2019-09-26 ENCOUNTER — Encounter: Payer: Self-pay | Admitting: Cardiology

## 2019-09-26 ENCOUNTER — Other Ambulatory Visit: Payer: Self-pay

## 2019-09-26 ENCOUNTER — Ambulatory Visit: Payer: 59 | Admitting: Cardiology

## 2019-09-26 VITALS — BP 138/82 | HR 79 | Temp 97.1°F | Ht 72.0 in | Wt 216.4 lb

## 2019-09-26 DIAGNOSIS — E7800 Pure hypercholesterolemia, unspecified: Secondary | ICD-10-CM | POA: Diagnosis not present

## 2019-09-26 DIAGNOSIS — I10 Essential (primary) hypertension: Secondary | ICD-10-CM

## 2019-09-26 DIAGNOSIS — R072 Precordial pain: Secondary | ICD-10-CM | POA: Diagnosis not present

## 2019-09-26 DIAGNOSIS — Z7189 Other specified counseling: Secondary | ICD-10-CM

## 2019-09-26 NOTE — Patient Instructions (Signed)
Medication Instructions:  Continue current medications  *If you need a refill on your cardiac medications before your next appointment, please call your pharmacy*   Lab Work: None Ordered  Testing/Procedures: Your physician has requested that you have an exercise tolerance test. For further information please visit [email or web address] Please also follow instruction sheet, as given.   Follow-Up: At [facility], you and your health needs are our priority.  As part of our continuing mission to provide you with exceptional heart care, we have created designated Provider Care Teams.  These Care Teams include your primary Cardiologist (physician) and Advanced Practice Providers (APPs -  Physician Assistants and Nurse Practitioners) who all work together to provide you with the care you need, when you need it.  We recommend signing up for the patient portal called "MyChart".  Sign up information is provided on this After Visit Summary.  MyChart is used to connect with patients for Virtual Visits (Telemedicine).  Patients are able to view lab/test results, encounter notes, upcoming appointments, etc.  Non-urgent messages can be sent to your provider as well.   To learn more about what you can do with MyChart, go to [email or web address].    Your next appointment:   6 month(s)  The format for your next appointment:   In Person  Provider:   You may see Dr Cristal Deer or one of the following Advanced Practice Providers on your designated Care Team:    Theodore Demark, PA-C  Joni Reining, DNP, ANP  Cadence Fransico Meryl, NP

## 2019-09-26 NOTE — Progress Notes (Signed)
Cardiology Office Note:    Date:  09/26/2019   ID:  Jerry Greene, DOB Nov 08, 1960, MRN [id number]  PCP:  Janeece Agee, NP  Cardiologist:  Jodelle Red, MD  Referring MD: Janeece Agee, NP   CC: new patient evaluation for chest pain  History of Present Illness:    Jerry Greene is a 59 y.o. male with a hx of hypertension, tobacco use who is seen as a new consult at the request of Janeece Agee, NP for the evaluation and management of chest pain.  Note from Janeece Agee dated 09/12/19 reviewed. Had two weeks of intermittent chest pain and one episode of sharp left arm pain. No associated symptoms. ECG noted as abnormal at that visit.  Chest pain: -Initial onset: started on his side on the right side about a month ago, felt like a cramp. Better with tylenol, able to work the cramp out. Then several week ago had similar pain on the left side. Worse when he tries to tighten things at work with his left arm. Sometimes feels more towards his shoulder. Also having bilateral back pain. -Quality: sometimes like a cramp, sometimes a sharp quick pain -Frequency: every few days -Duration: less than a minute -Associated symptoms: none -Aggravating/alleviating factors: worse when he is trying to use his arms/twist things at work -Prior cardiac history: told he had a leaky valve in the past, but very slight. -Prior ECG: NSR, nonspecific TWI similar to prior -Prior workup: had a stress test 2019, referred by Dr. Urban Gibson. I have no records, thinks that Dr. Annitta Jersey name seems familiar. Told it was normal. Per Epic, there are mentions of a stress on 07-27-17 but  -Prior treatment: none -Alcohol: 1-2 glasses of wine several times/week -Tobacco: current, 1/2 ppd. Trying to quit. We discussed at length options for quitting. He goes all day without smoking, discussed that gum/lozenge/inhaler may work. Concerned now about the mental piece. His wife did well on bupropion. He tried samples of  chantix in the past but could not afford the prescription. He is very interested in a medication, recommended he discuss with Janeece Agee. His BP is better controlled than previously, could consider bupropion if he monitors BP. -diet: has been on a diet for 17 days. No fried food. Lots of grilling/baked chicken and fish, salads -Comorbidities: no history of diabetes (has been told he is prediabetic).  -Exercise level: no routine activity any more. Worried about his heart being able to tolerate. -Cardiac ROS: no shortness of breath, no PND, no orthopnea, no LE edema, no syncope -Family history: no heart disease that he knows of.  Had Covid in 03/2019, not hospitalized.   Works 10 hours days, physically active, but feels very sore by the end of the day. Improves with tylenol  Past Medical History:  Diagnosis Date  . COVID-19 virus infection 05/15/2019  . Hyperlipidemia   . Hypertension     No past surgical history on file.  Current Medications: Current Outpatient Medications on File Prior to Visit  Medication Sig  . amLODipine (NORVASC) 10 MG tablet Take 1 tablet (10 mg total) by mouth daily.  Marland Kitchen losartan (COZAAR) 50 MG tablet Take 1 tablet (50 mg total) by mouth daily.  . sildenafil (VIAGRA) 50 MG tablet Take 1 tablet (50 mg total) by mouth daily as needed for erectile dysfunction.   No current facility-administered medications on file prior to visit.     Allergies:   Patient has no known allergies.   Social History   Tobacco Use  .  Smoking status: Current Every Day Smoker    Packs/day: 0.65    Years: 40.00    Pack years: 26.00    Types: Cigarettes  . Smokeless tobacco: Never Used  . Tobacco comment: approxiamately 5-6 cigs/day  Substance Use Topics  . Alcohol use: Yes    Alcohol/week: 3.0 standard drinks    Types: 3 Cans of beer per week  . Drug use: No    Family History: family history includes Diabetes in his brother, mother, and sister. There is no history of  Colon cancer, Esophageal cancer, Rectal cancer, or Stomach cancer.  ROS:   Please see the history of present illness.  Additional pertinent ROS: Constitutional: Negative for chills, fever, night sweats, unintentional weight loss  HENT: Negative for ear pain and hearing loss.   Eyes: Negative for loss of vision and eye pain.  Respiratory: Negative for cough, sputum, wheezing.   Cardiovascular: See HPI. Gastrointestinal: Negative for abdominal pain, melena, and hematochezia.  Genitourinary: Negative for dysuria and hematuria.  Musculoskeletal: Negative for falls and myalgias.  Skin: Negative for itching and rash.  Neurological: Negative for focal weakness, focal sensory changes and loss of consciousness.  Endo/Heme/Allergies: Does not bruise/bleed easily.     EKGs/Labs/Other Studies Reviewed:    The following studies were reviewed today: No prior cardiac studies  EKG:  EKG is personally reviewed.  The ekg ordered today demonstrates NSR at 79 bpm, nonspecific t wave flattening  Recent Labs: 11/15/2018: Hemoglobin 14.5; Platelets 215; TSH 0.955 09/12/2019: ALT 11; BNP <2.5; BUN 9; Creatinine, Ser 1.28; Potassium 4.2; Sodium 139  Recent Lipid Panel    Component Value Date/Time   CHOL 195 11/15/2018 1605   TRIG 114 11/15/2018 1605   HDL 39 (L) 11/15/2018 1605   CHOLHDL 5.0 11/15/2018 1605   LDLCALC 133 (H) 11/15/2018 1605    Physical Exam:    VS:  BP 138/82   Pulse 79   Temp (!) 97.1 F (36.2 C)   Ht 6' (1.829 m)   Wt 216 lb 6.4 oz (98.2 kg)   SpO2 99%   BMI 29.35 kg/m     Wt Readings from Last 3 Encounters:  09/26/19 216 lb 6.4 oz (98.2 kg)  09/12/19 220 lb (99.8 kg)  06/20/19 220 lb (99.8 kg)    GEN: Well nourished, well developed in no acute distress HEENT: Normal, moist mucous membranes NECK: No JVD CARDIAC: regular rhythm, normal S1 and S2, no rubs or gallops. No murmurs. VASCULAR: Radial and DP pulses 2+ bilaterally. No carotid bruits RESPIRATORY:  Clear to  auscultation without rales, wheezing or rhonchi  ABDOMEN: Soft, non-tender, non-distended MUSCULOSKELETAL:  Ambulates independently SKIN: Warm and dry, no edema NEUROLOGIC:  Alert and oriented x 3. No focal neuro deficits noted. PSYCHIATRIC:  Normal affect    ASSESSMENT:    1. Precordial pain   2. Essential hypertension   3. Cardiac risk counseling   4. Counseling on health promotion and disease prevention   5. Pure hypercholesterolemia    PLAN:    Chest pain: -discussed treadmill stress, nuclear stress/lexiscan, and CT coronary angiography. Discussed pros and cons of each, including but not limited to false positive/false negative risk, radiation risk, and risk of IV contrast dye. Based on shared decision making, decision was made to pursue exercise treadmill stress test  Hypertension: given elevated ASCVD risk score, would prefer tighter control of blood pressure.  -continue amlodipine, losartan -discussed how to monitor BP -if remains elevated, would switch losartan to valsartan or add  chlorthalidone  Hypercholesterolemia: -LDL 133 -with elevated ASCVD risk, would recommend a statin -he would like to do stress test first. Would also like to work on diet and exercise. He prefers to recheck lipids in 6 mos and then discuss again  Cardiac risk counseling and prevention recommendations: -recommend heart healthy/Mediterranean diet, with whole grains, fruits, vegetable, fish, lean meats, nuts, and olive oil. Limit salt. -recommend moderate walking, 3-5 times/week for 30-50 minutes each session. Aim for at least 150 minutes.week. Goal should be pace of 3 miles/hours, or walking 1.5 miles in 30 minutes -recommend avoidance of tobacco products. Avoid excess alcohol. -Additional risk factor control:  -Diabetes risk: A1c is 6.0  -Lipids: as above  -Blood pressure control: as above  -Weight: BMI 29 -ASCVD risk score: The 10-year ASCVD risk score Mikey Bussing DC Brooke Bonito., et al., 2013) is: 25.3%    Values used to calculate the score:     Age: 89 years     Sex: Male     Is Non-Hispanic African American: Yes     Diabetic: No     Tobacco smoker: Yes     Systolic Blood Pressure: 324 mmHg     Is BP treated: Yes     HDL Cholesterol: 39 mg/dL     Total Cholesterol: 195 mg/dL    Plan for follow up: 6 mos, repeat lipids  Buford Dresser, MD, PhD [place]  [facility]    Medication Adjustments/Labs and Tests Ordered: Current medicines are reviewed at length with the patient today.  Concerns regarding medicines are outlined above.  Orders Placed This Encounter  Procedures  . EXERCISE TOLERANCE TEST (ETT)  . EKG 12-Lead   No orders of the defined types were placed in this encounter.   Patient Instructions  Medication Instructions:  Continue current medications  *If you need a refill on your cardiac medications before your next appointment, please call your pharmacy*   Lab Work: None Ordered  Testing/Procedures: Your physician has requested that you have an exercise tolerance test. For further information please visit [email or web address]. Please also follow instruction sheet, as given.   Follow-Up: At [facility], you and your health needs are our priority.  As part of our continuing mission to provide you with exceptional heart care, we have created designated Provider Care Teams.  These Care Teams include your primary Cardiologist (physician) and Advanced Practice Providers (APPs -  Physician Assistants and Nurse Practitioners) who all work together to provide you with the care you need, when you need it.  We recommend signing up for the patient portal called "MyChart".  Sign up information is provided on this After Visit Summary.  MyChart is used to connect with patients for Virtual Visits (Telemedicine).  Patients are able to view lab/test results, encounter notes, upcoming appointments, etc.  Non-urgent messages can be sent to your provider as well.   To  learn more about what you can do with MyChart, go to [email or web address].    Your next appointment:   6 month(s)  The format for your next appointment:   In Person  Provider:   You may see Dr Harrell Gave or one of the following Advanced Practice Providers on your designated Care Team:    Rosaria Ferries, PA-C  Jory Sims, DNP, ANP  Cadence Kathlen Mody, NP      Signed, Buford Dresser, MD PhD 09/26/2019   [place]

## 2019-10-03 NOTE — Telephone Encounter (Signed)
The patient has had the appointment already. Encounter closed.

## 2019-10-14 ENCOUNTER — Other Ambulatory Visit (HOSPITAL_COMMUNITY)
Admission: RE | Admit: 2019-10-14 | Discharge: 2019-10-14 | Disposition: A | Discharge status: Home / Self Care | Payer: 59 | Source: Ambulatory Visit | Attending: Cardiology | Admitting: Cardiology

## 2019-10-14 DIAGNOSIS — Z20822 Contact with and (suspected) exposure to covid-19: Secondary | ICD-10-CM | POA: Diagnosis not present

## 2019-10-14 DIAGNOSIS — Z01812 Encounter for preprocedural laboratory examination: Secondary | ICD-10-CM | POA: Diagnosis not present

## 2019-10-14 LAB — SARS CORONAVIRUS 2 (TAT 6-24 HRS): SARS Coronavirus 2: NEGATIVE

## 2019-10-15 ENCOUNTER — Telehealth (HOSPITAL_COMMUNITY): Payer: Self-pay

## 2019-10-15 NOTE — Telephone Encounter (Signed)
Encounter complete. 

## 2019-10-17 ENCOUNTER — Ambulatory Visit (HOSPITAL_COMMUNITY)
Admission: RE | Admit: 2019-10-17 | Discharge: 2019-10-17 | Disposition: A | Discharge status: Home / Self Care | Payer: 59 | Source: Ambulatory Visit | Attending: Internal Medicine | Admitting: Internal Medicine

## 2019-10-17 ENCOUNTER — Other Ambulatory Visit: Payer: Self-pay

## 2019-10-17 DIAGNOSIS — R072 Precordial pain: Secondary | ICD-10-CM | POA: Insufficient documentation

## 2019-10-17 LAB — EXERCISE TOLERANCE TEST
Estimated workload: 7 METS
Exercise duration (min): 6 min
Exercise duration (sec): 0 s
MPHR: 162 {beats}/min
Peak HR: 150 {beats}/min
Percent HR: 92 %
Rest HR: 97 {beats}/min

## 2019-11-10 ENCOUNTER — Encounter: Payer: Self-pay | Admitting: Cardiology

## 2019-11-21 ENCOUNTER — Ambulatory Visit (INDEPENDENT_AMBULATORY_CARE_PROVIDER_SITE_OTHER): Payer: 59

## 2019-11-21 ENCOUNTER — Encounter: Payer: Self-pay | Admitting: Family Medicine

## 2019-11-21 ENCOUNTER — Ambulatory Visit: Payer: 59 | Admitting: Family Medicine

## 2019-11-21 ENCOUNTER — Other Ambulatory Visit: Payer: Self-pay

## 2019-11-21 VITALS — BP 134/83 | HR 80 | Temp 98.4°F | Resp 15 | Ht 72.0 in | Wt 211.0 lb

## 2019-11-21 DIAGNOSIS — Z720 Tobacco use: Secondary | ICD-10-CM | POA: Diagnosis not present

## 2019-11-21 DIAGNOSIS — R0789 Other chest pain: Secondary | ICD-10-CM

## 2019-11-21 DIAGNOSIS — J439 Emphysema, unspecified: Secondary | ICD-10-CM

## 2019-11-21 DIAGNOSIS — R140 Abdominal distension (gaseous): Secondary | ICD-10-CM | POA: Diagnosis not present

## 2019-11-21 MED ORDER — OMEPRAZOLE 40 MG PO CPDR: 40.0000 mg | DELAYED_RELEASE_CAPSULE | Freq: Every day | ORAL | 0 refills | Status: DC

## 2019-11-21 NOTE — Progress Notes (Signed)
Patient ID: Jerry Greene, male    DOB: 1961-01-02  Age: 59 y.o. MRN: [id number]  Chief Complaint  Patient presents with  . Chest Pain    pt is following up from cardiology where they reported his heart is just fine the chest tightness seems uninvolved, pt notes his pain moves to his shoulder sometimes. Pt wife wants to know if he is at risk for pericaditis.   Marland Kitchen Epistaxis    pt had a bloody nose 6/24, and 6/21, pt states when he can plug his nose it stops unsure if this is from his BP     Subjective:  59 year old man who is here for continued problems with chest pains.  He has them intermittently.  Back in April patient had these pains.  He was seen here and evaluated and everything looked pretty good with the exception of some nonspecific T wave flattening.  He was sent to a cardiologist, Dr. Janne Napoleon.  Decision was made to get an EKG treadmill stress test, but I do not see that report that it was done.  His 10-year risk factor was calculated at 25%.  He is scheduled to see her back in 6 months.  He has had 2 recent nosebleeds this week.  That is unusual for him but they stopped pretty easily with packing his nostril with some Kleenex.  He works regularly and occasionally has had a little chest pain when he was working, but other times gets it when he is sitting at home watching TV.  He is a smoker, 7 or 8 cigarettes a day.  Time quitting.  Current allergies, medications, problem list, past/family and social histories reviewed.  Objective:  BP 134/83   Pulse 80   Temp 98.4 F (36.9 C) (Temporal)   Resp 15   Ht 6' (1.829 m)   Wt 211 lb (95.7 kg)   SpO2 99%   BMI 28.62 kg/m  Healthy-appearing gentleman.  Vital signs are stable with blood pressure 134/83.  He is alert and oriented.  Throat clear.  TMs occluded with cerumen.  Both sides of his nose look clean with no sores or bleeding seen from the left nares where he had the bleeds.  Neck supple without nodes or thyromegaly.   No carotid bruits.  Chest clear.  Heart regular without murmur.  Abdomen soft without mass or tenderness.  EKG normal except for the ongoing nonspecific ST-T wave flattening.  His rhythm strip is normal.  Assessment & Plan:   Assessment: 1. Other chest pain   2. Tobacco abuse   3. Sensation of gaseous abdominal fullness   4. Pulmonary emphysema, unspecified emphysema type (HCC)       Plan: See instructions  Orders Placed This Encounter  Procedures  . DG Chest 2 View    Standing Status:   Future    Number of Occurrences:   1    Standing Expiration Date:   11/20/2020    Order Specific Question:   Reason for Exam (SYMPTOM  OR DIAGNOSIS REQUIRED)    Answer:   left chest wall pain in smoker    Order Specific Question:   Preferred imaging location?    Answer:   External  . EKG 12-Lead    Meds ordered this encounter  Medications  . omeprazole (PRILOSEC) 40 MG capsule    Sig: Take 1 capsule (40 mg total) by mouth daily.    Dispense:  30 capsule    Refill:  0  Patient Instructions   Your tests look pretty good.  I do not believe this is heart pain.  You do have evidence of emphysema on your chest x-ray but no other abnormalities.  The emphysema is caused by cigarette smoking.  It is very important that you discontinue cigarettes  Some of the pain may come from trapping gas around your spleen.  I am giving you a stomach acid suppressing medication to see if it helps.  In the event of any acute severe chest pains go to the emergency room or call 911.  Return if problems continue to persist.    If you have lab work done today you will be contacted with your lab results within the next 2 weeks.  If you have not heard from [place] then please contact us. The fastest way to get your results is to register for My Chart.   IF you received an x-ray today, you will receive an invoice from [facility]. Please contact [facility] at [phone] with  questions or concerns regarding your invoice.   IF you received labwork today, you will receive an invoice from [place]. Please contact [facility] at [phone] with questions or concerns regarding your invoice.   Our billing staff will not be able to assist you with questions regarding bills from these companies.  You will be contacted with the lab results as soon as they are available. The fastest way to get your results is to activate your My Chart account. Instructions are located on the last page of this paperwork. If you have not heard from [place] regarding the results in 2 weeks, please contact this office.        Return if symptoms worsen or fail to improve.

## 2019-11-21 NOTE — Patient Instructions (Addendum)
Your tests look pretty good.  I do not believe this is heart pain.  You do have evidence of emphysema on your chest x-ray but no other abnormalities.  The emphysema is caused by cigarette smoking.  It is very important that you discontinue cigarettes  Some of the pain may come from trapping gas around your spleen.  I am giving you a stomach acid suppressing medication to see if it helps.  In the event of any acute severe chest pains go to the emergency room or call 911.  Return if problems continue to persist.    If you have lab work done today you will be contacted with your lab results within the next 2 weeks.  If you have not heard from [place] then please contact us. The fastest way to get your results is to register for My Chart.   IF you received an x-ray today, you will receive an invoice from [facility]. Please contact [facility] at [phone] with questions or concerns regarding your invoice.   IF you received labwork today, you will receive an invoice from [place]. Please contact [facility] at [phone] with questions or concerns regarding your invoice.   Our billing staff will not be able to assist you with questions regarding bills from these companies.  You will be contacted with the lab results as soon as they are available. The fastest way to get your results is to activate your My Chart account. Instructions are located on the last page of this paperwork. If you have not heard from [place] regarding the results in 2 weeks, please contact this office.

## 2019-12-25 ENCOUNTER — Other Ambulatory Visit: Payer: Self-pay | Admitting: Registered Nurse

## 2019-12-25 DIAGNOSIS — I10 Essential (primary) hypertension: Secondary | ICD-10-CM

## 2019-12-25 MED ORDER — LOSARTAN POTASSIUM 50 MG PO TABS: 50.0000 mg | ORAL_TABLET | Freq: Every day | ORAL | 0 refills | Status: DC

## 2019-12-25 MED ORDER — AMLODIPINE BESYLATE 10 MG PO TABS: 10.0000 mg | ORAL_TABLET | Freq: Every day | ORAL | 0 refills | Status: DC

## 2019-12-25 NOTE — Telephone Encounter (Signed)
Copied from CRM [id number]. Topic: Quick Communication - Rx Refill/Question >> Dec 25, 2019  4:00 PM Jaquita Rector A wrote: Medication: amLODipine (NORVASC) 10 MG tablet, losartan (COZAAR) 50 MG tablet  Need Losartan immediately 1 pill left  Has the patient contacted their pharmacy? Yes.   (Agent: If no, request that the patient contact the pharmacy for the refill.) (Agent: If yes, when and what did the pharmacy advise?)  Preferred Pharmacy (with phone number or street name): [facility] - [place], [place] - [address]  Phone:  [phone] Fax:  [phone]     Agent: Please be advised that RX refills may take up to 3 business days. We ask that you follow-up with your pharmacy.

## 2020-04-02 ENCOUNTER — Other Ambulatory Visit: Payer: Self-pay

## 2020-04-02 ENCOUNTER — Encounter: Payer: Self-pay | Admitting: Cardiology

## 2020-04-02 ENCOUNTER — Ambulatory Visit: Payer: 59 | Admitting: Cardiology

## 2020-04-02 VITALS — BP 130/80 | HR 88 | Ht 72.0 in | Wt 224.8 lb

## 2020-04-02 DIAGNOSIS — Z7189 Other specified counseling: Secondary | ICD-10-CM

## 2020-04-02 DIAGNOSIS — Z79899 Other long term (current) drug therapy: Secondary | ICD-10-CM

## 2020-04-02 DIAGNOSIS — R0789 Other chest pain: Secondary | ICD-10-CM

## 2020-04-02 DIAGNOSIS — E7800 Pure hypercholesterolemia, unspecified: Secondary | ICD-10-CM

## 2020-04-02 DIAGNOSIS — I10 Essential (primary) hypertension: Secondary | ICD-10-CM

## 2020-04-02 NOTE — Progress Notes (Signed)
Cardiology Office Note:    Date:  04/02/2020   ID:  Jerry Greene, DOB 11/07/1960, MRN [id number]  PCP:  Maximiano Coss, NP  Cardiologist:  Buford Dresser, MD  Referring MD: Maximiano Coss, NP   CC: follow up  History of Present Illness:    Jerry Greene is a 59 y.o. male with a hx of hypertension, tobacco use who is seen for follow up today. I initially met him 09/26/19 as a new consult at the request of Maximiano Coss, NP for the evaluation and management of chest pain.  Cardiac history: seen for chest pain, ETT 10/23/19 normal. CV risk: tobacco use, hypertension  Today: Having different discomfort than prior. Still gets sharp pain, but now has more of a cramping sensation in his chest. Feels about every other day, lasts seconds, better if he moves/changes position. Nontender. Can be across the upper chest to the shoulder. No associated symptoms. Better with tylenol.   Having cramps in the legs, wondering if his potassium is low. Has been on losartan and amlodipine for about a year.  Has tried to quit smoking without success. Making a plan. Cutting back over the last two days, had 4 cigarettes yesterday. Quit for a little while in the past, years ago.   Denies shortness of breath at rest or with normal exertion. No PND, orthopnea, LE edema or unexpected weight gain. No syncope or palpitations.  Past Medical History:  Diagnosis Date  . COVID-19 virus infection 05/15/2019  . Hyperlipidemia   . Hypertension     No past surgical history on file.  Current Medications: Current Outpatient Medications on File Prior to Visit  Medication Sig  . amLODipine (NORVASC) 10 MG tablet Take 1 tablet (10 mg total) by mouth daily.  Marland Kitchen losartan (COZAAR) 50 MG tablet Take 1 tablet (50 mg total) by mouth daily.  Marland Kitchen omeprazole (PRILOSEC) 40 MG capsule Take 1 capsule (40 mg total) by mouth daily.   No current facility-administered medications on file prior to visit.     Allergies:    Patient has no known allergies.   Social History   Tobacco Use  . Smoking status: Current Every Day Smoker    Packs/day: 0.65    Years: 40.00    Pack years: 26.00    Types: Cigarettes  . Smokeless tobacco: Never Used  . Tobacco comment: approxiamately 5-6 cigs/day  Vaping Use  . Vaping Use: Never used  Substance Use Topics  . Alcohol use: Yes    Alcohol/week: 3.0 standard drinks    Types: 3 Cans of beer per week  . Drug use: No    Family History: family history includes Diabetes in his brother, mother, and sister. There is no history of Colon cancer, Esophageal cancer, Rectal cancer, or Stomach cancer.  ROS:   Please see the history of present illness.  Additional pertinent ROS otherwise unremarkable.   EKGs/Labs/Other Studies Reviewed:    The following studies were reviewed today: ETT 10-23-19  Blood pressure demonstrated a normal response to exercise.  Upsloping ST segment depression ST segment depression was noted during stress.   Negative adequate stress test.   EKG:  EKG is personally reviewed.  The ekg ordered 09/26/19 demonstrates NSR at 79 bpm, nonspecific t wave flattening  Recent Labs: 09/12/2019: ALT 11; BNP <2.5; BUN 9; Creatinine, Ser 1.28; Potassium 4.2; Sodium 139  Recent Lipid Panel    Component Value Date/Time   CHOL 195 11/15/2018 1605   TRIG 114 11/15/2018 1605   HDL 39 (  L) 11/15/2018 1605   CHOLHDL 5.0 11/15/2018 1605   LDLCALC 133 (H) 11/15/2018 1605    Physical Exam:    VS:  BP 130/80   Pulse 88   Ht 6' (1.829 m)   Wt 224 lb 12.8 oz (102 kg)   SpO2 97%   BMI 30.49 kg/m     Wt Readings from Last 3 Encounters:  04/02/20 224 lb 12.8 oz (102 kg)  11/21/19 211 lb (95.7 kg)  09/26/19 216 lb 6.4 oz (98.2 kg)    GEN: Well nourished, well developed in no acute distress HEENT: Normal, moist mucous membranes NECK: No JVD CARDIAC: regular rhythm, normal S1 and S2, no rubs or gallops. No murmur. VASCULAR: Radial and DP pulses 2+  bilaterally. No carotid bruits RESPIRATORY:  Clear to auscultation without rales, wheezing or rhonchi  ABDOMEN: Soft, non-tender, non-distended MUSCULOSKELETAL:  Ambulates independently SKIN: Warm and dry, no edema NEUROLOGIC:  Alert and oriented x 3. No focal neuro deficits noted. PSYCHIATRIC:  Normal affect   ASSESSMENT:    1. Atypical chest pain   2. Medication management   3. Essential hypertension   4. Pure hypercholesterolemia   5. Counseling on health promotion and disease prevention   6. Cardiac risk counseling    PLAN:    Chest pain: -atypical symptoms, normal ETT prior -suspect noncardiac in etiology, but discussed red flag warning signs that need immediate medical attention  Hypertension: just at goal today -continue amlodipine, losartan -discussed how to monitor BP -if becomes elevated, would switch losartan to valsartan or add chlorthalidone -check BMET today  Hypercholesterolemia: -LDL 133 -with elevated ASCVD risk, would recommend a statin -discussed rechecking lipids, he is not fasting. He would like more time to work on diet/exercise before discussing statin -I recommended he continue to discuss with his PCP as well  Cardiac risk counseling and prevention recommendations: -recommend heart healthy/Mediterranean diet, with whole grains, fruits, vegetable, fish, lean meats, nuts, and olive oil. Limit salt. -recommend moderate walking, 3-5 times/week for 30-50 minutes each session. Aim for at least 150 minutes.week. Goal should be pace of 3 miles/hours, or walking 1.5 miles in 30 minutes -recommend avoidance of tobacco products. Avoid excess alcohol. -ASCVD risk score: The 10-year ASCVD risk score Mikey Bussing DC Brooke Bonito., et al., 2013) is: 22.3%   Values used to calculate the score:     Age: 32 years     Sex: Male     Is Non-Hispanic African American: Yes     Diabetic: No     Tobacco smoker: Yes     Systolic Blood Pressure: 235 mmHg     Is BP treated: Yes     HDL  Cholesterol: 39 mg/dL     Total Cholesterol: 195 mg/dL    Plan for follow up: 1 year or sooner as needed  Buford Dresser, MD, PhD [place]  [facility]    Medication Adjustments/Labs and Tests Ordered: Current medicines are reviewed at length with the patient today.  Concerns regarding medicines are outlined above.  Orders Placed This Encounter  Procedures  . Basic metabolic panel   No orders of the defined types were placed in this encounter.   Patient Instructions  Medication Instructions:  Your Physician recommend you continue on your current medication as directed.    *If you need a refill on your cardiac medications before your next appointment, please call your pharmacy*   Lab Work: None   Testing/Procedures: None   Follow-Up: At [facility], you and your health  needs are our priority.  As part of our continuing mission to provide you with exceptional heart care, we have created designated Provider Care Teams.  These Care Teams include your primary Cardiologist (physician) and Advanced Practice Providers (APPs -  Physician Assistants and Nurse Practitioners) who all work together to provide you with the care you need, when you need it.  We recommend signing up for the patient portal called "MyChart".  Sign up information is provided on this After Visit Summary.  MyChart is used to connect with patients for Virtual Visits (Telemedicine).  Patients are able to view lab/test results, encounter notes, upcoming appointments, etc.  Non-urgent messages can be sent to your provider as well.   To learn more about what you can do with MyChart, go to [email or web address].    Your next appointment:   1 year(s)  The format for your next appointment:   In Person  Provider:   Buford Dresser, MD      Signed, Buford Dresser, MD PhD 04/02/2020   [place]

## 2020-04-02 NOTE — Patient Instructions (Signed)

## 2020-04-03 LAB — BASIC METABOLIC PANEL
BUN/Creatinine Ratio: 8 — ABNORMAL LOW (ref 9–20)
BUN: 12 mg/dL (ref 6–24)
CO2: 22 mmol/L (ref 20–29)
Calcium: 9.8 mg/dL (ref 8.7–10.2)
Chloride: 102 mmol/L (ref 96–106)
Creatinine, Ser: 1.45 mg/dL — ABNORMAL HIGH (ref 0.76–1.27)
GFR calc Af Amer: 61 mL/min/{1.73_m2} (ref >59)
GFR calc non Af Amer: 53 mL/min/{1.73_m2} — ABNORMAL LOW (ref >59)
Glucose: 82 mg/dL (ref 65–99)
Potassium: 4.4 mmol/L (ref 3.5–5.2)
Sodium: 141 mmol/L (ref 134–144)

## 2020-04-05 ENCOUNTER — Other Ambulatory Visit: Payer: Self-pay | Admitting: Registered Nurse

## 2020-04-05 DIAGNOSIS — I10 Essential (primary) hypertension: Secondary | ICD-10-CM

## 2020-04-05 MED ORDER — AMLODIPINE BESYLATE 10 MG PO TABS: 10.0000 mg | ORAL_TABLET | Freq: Every day | ORAL | 0 refills | Status: DC

## 2020-04-05 MED ORDER — LOSARTAN POTASSIUM 50 MG PO TABS: 50.0000 mg | ORAL_TABLET | Freq: Every day | ORAL | 0 refills | Status: DC

## 2020-04-05 NOTE — Telephone Encounter (Signed)
Pt request refill  amLODipine (NORVASC) 10 MG tablet losartan (COZAAR) 50 MG tablet  sildenafil (VIAGRA) 50 MG tablet  [facility] [address] [place], [place] - [address] Phone:  [phone]  Fax:  [phone]

## 2020-04-05 NOTE — Telephone Encounter (Signed)
Requested medication (s) are due for refill today: Sildenafil, no  Requested medication (s) are on the active medication list: no  Last refill: ?  Future visit scheduled:no  Notes to clinic: not on active med list

## 2020-04-05 NOTE — Telephone Encounter (Signed)
Requested Prescriptions  Pending Prescriptions Disp Refills   amLODipine (NORVASC) 10 MG tablet 90 tablet 0    Sig: Take 1 tablet (10 mg total) by mouth daily.     Cardiovascular:  Calcium Channel Blockers Passed - 04/05/2020  1:11 PM      Passed - Last BP in normal range    BP Readings from Last 1 Encounters:  04/02/20 130/80         Passed - Valid encounter within last 6 months    Recent Outpatient Visits          4 months ago Other chest pain   [facility], Sandria Bales, MD   6 months ago Other chest pain   [facility], Gerlene Burdock, NP   9 months ago Essential hypertension   [facility], Gerlene Burdock, NP   11 months ago COVID-19 virus infection   [facility], Lonna Cobb, NP   1 year ago Screening for endocrine, metabolic and immunity disorder   [facility], Gerlene Burdock, NP              losartan (COZAAR) 50 MG tablet 90 tablet 0    Sig: Take 1 tablet (50 mg total) by mouth daily.     Cardiovascular:  Angiotensin Receptor Blockers Failed - 04/05/2020  1:11 PM      Failed - Cr in normal range and within 180 days    Creatinine, Ser  Date Value Ref Range Status  04/02/2020 1.45 (H) 0.76 - 1.27 mg/dL Final         Passed - K in normal range and within 180 days    Potassium  Date Value Ref Range Status  04/02/2020 4.4 3.5 - 5.2 mmol/L Final         Passed - Patient is not pregnant      Passed - Last BP in normal range    BP Readings from Last 1 Encounters:  04/02/20 130/80         Passed - Valid encounter within last 6 months    Recent Outpatient Visits          4 months ago Other chest pain   [facility], Sandria Bales, MD   6 months ago Other chest pain   [facility], Gerlene Burdock, NP   9 months ago Essential hypertension   [facility], Gerlene Burdock, NP   11 months ago COVID-19 virus infection   [facility] at [facility], Lonna Cobb, NP    1 year ago Screening for endocrine, metabolic and immunity disorder   [facility], Gerlene Burdock, NP

## 2020-06-08 ENCOUNTER — Ambulatory Visit: Payer: Self-pay | Admitting: Registered Nurse

## 2020-06-11 ENCOUNTER — Ambulatory Visit: Payer: 59 | Admitting: Family Medicine

## 2020-06-11 ENCOUNTER — Other Ambulatory Visit: Payer: Self-pay

## 2020-06-11 ENCOUNTER — Encounter: Payer: Self-pay | Admitting: Family Medicine

## 2020-06-11 VITALS — BP 133/89 | HR 103 | Temp 98.3°F | Ht 72.0 in | Wt 226.2 lb

## 2020-06-11 DIAGNOSIS — R002 Palpitations: Secondary | ICD-10-CM | POA: Diagnosis not present

## 2020-06-11 DIAGNOSIS — J439 Emphysema, unspecified: Secondary | ICD-10-CM | POA: Diagnosis not present

## 2020-06-11 DIAGNOSIS — R0789 Other chest pain: Secondary | ICD-10-CM | POA: Diagnosis not present

## 2020-06-11 DIAGNOSIS — F419 Anxiety disorder, unspecified: Secondary | ICD-10-CM

## 2020-06-11 MED ORDER — SERTRALINE HCL 50 MG PO TABS: ORAL_TABLET | ORAL | 3 refills | Status: DC

## 2020-06-11 NOTE — Progress Notes (Signed)
Patient ID: Jerry Greene, male    DOB: 1961/04/16  Age: 60 y.o. MRN: [id number]  Chief Complaint  Patient presents with  . Spasms    Pt has intermittent spasms through back, sides, abdomen, lower chest. Has seen Cards for full cardiac work up and they have ruled out cardiac involvement at this time he doe see them regularly for follow ups believes this issue is going on 1 year      Subjective:   Patient is here complaining of chest wall spasms.  They have been spasming in his chest wall and his abdomen.  I saw him last summer for similar.  It has gotten worse lately.  His wife thinks it is anxiety.  He got up in the morning and had a bad spell that lasted about 2 minutes.  There are some stresses at home.  He also has some stress and that he has to see his ex-wife regularly when he does things with the children.  No nausea or vomiting.  No real palpitations, though he does feel a fluttering in his chest which might be palpitations.  Cardiologist has seen him a couple times and cleared him.  Current allergies, medications, problem list, past/family and social histories reviewed.  Objective:  There were no vitals taken for this visit.  No acute distress.  Alert and oriented.  Chest clear.  Heart regular without murmur.  Abdomen soft without mass or tenderness.  No chest wall tenderness could be palpated his heart was actually a little fast, 108 when I examined him but the EKG has slowed down.  EKG is essentially normal with some mild nonspecific ST T wave abnormalities in the precordial leads. Assessment & Plan:   Assessment: 1. Anxiety   2. Palpitations   3. Chest wall pain   4. Pulmonary emphysema, unspecified emphysema type (HCC)       Plan: Nonspecific chest pains.  Probably anxiety  Orders Placed This Encounter  Procedures  . DG Chest 2 View    Standing Status:   Future    Standing Expiration Date:   06/11/2021    Order Specific Question:   Reason for Exam (SYMPTOM  OR  DIAGNOSIS REQUIRED)    Answer:   Emphysema, chest wall spasms    Order Specific Question:   Preferred imaging location?    Answer:   External  . CBC  . Basic metabolic panel  . TSH  . EKG 12-Lead    Meds ordered this encounter  Medications  . sertraline (ZOLOFT) 50 MG tablet    Sig: Take 1 daily for anxiety.    Dispense:  30 tablet    Refill:  3         Patient Instructions   Get a chest x-ray on Tuesday here.  You will need to come in for a nurse visit and get the chest x-ray.  I do not believe this is heart.  It probably is anxiety and muscle spasms.  I am starting you on a medicine to take 1 every day for anxiety.  It takes about 2 weeks for it to start helping and and at that time I hope you will be feeling better.  Return if worse.  Get rechecked in about 2 months.    If you have lab work done today you will be contacted with your lab results within the next 2 weeks.  If you have not heard from [place] then please contact us. The fastest way to get your results is  to register for My Chart.   IF you received an x-ray today, you will receive an invoice from [facility]. Please contact [facility] at [phone] with questions or concerns regarding your invoice.   IF you received labwork today, you will receive an invoice from [place]. Please contact [facility] at [phone] with questions or concerns regarding your invoice.   Our billing staff will not be able to assist you with questions regarding bills from these companies.  You will be contacted with the lab results as soon as they are available. The fastest way to get your results is to activate your My Chart account. Instructions are located on the last page of this paperwork. If you have not heard from [place] regarding the results in 2 weeks, please contact this office.        Return in about 2 months (around 08/09/2020), or Janeece Agee, NP, for Anxiety and chest wall spasms.   Janace Hoard,  MD 06/11/2020

## 2020-06-11 NOTE — Patient Instructions (Addendum)
Get a chest x-ray on Tuesday here.  You will need to come in for a nurse visit and get the chest x-ray.  I do not believe this is heart.  It probably is anxiety and muscle spasms.  I am starting you on a medicine to take 1 every day for anxiety.  It takes about 2 weeks for it to start helping and and at that time I hope you will be feeling better.  Return if worse.  Get rechecked in about 2 months.    If you have lab work done today you will be contacted with your lab results within the next 2 weeks.  If you have not heard from [place] then please contact us. The fastest way to get your results is to register for My Chart.   IF you received an x-ray today, you will receive an invoice from [facility]. Please contact [facility] at [phone] with questions or concerns regarding your invoice.   IF you received labwork today, you will receive an invoice from [place]. Please contact [facility] at [phone] with questions or concerns regarding your invoice.   Our billing staff will not be able to assist you with questions regarding bills from these companies.  You will be contacted with the lab results as soon as they are available. The fastest way to get your results is to activate your My Chart account. Instructions are located on the last page of this paperwork. If you have not heard from [place] regarding the results in 2 weeks, please contact this office.

## 2020-06-12 LAB — TSH: TSH: 0.662 u[IU]/mL (ref 0.450–4.500)

## 2020-06-12 LAB — BASIC METABOLIC PANEL
BUN/Creatinine Ratio: 8 — ABNORMAL LOW (ref 9–20)
BUN: 11 mg/dL (ref 6–24)
CO2: 21 mmol/L (ref 20–29)
Calcium: 9.6 mg/dL (ref 8.7–10.2)
Chloride: 105 mmol/L (ref 96–106)
Creatinine, Ser: 1.36 mg/dL — ABNORMAL HIGH (ref 0.76–1.27)
GFR calc Af Amer: 65 mL/min/{1.73_m2} (ref >59)
GFR calc non Af Amer: 57 mL/min/{1.73_m2} — ABNORMAL LOW (ref >59)
Glucose: 92 mg/dL (ref 65–99)
Potassium: 4.1 mmol/L (ref 3.5–5.2)
Sodium: 141 mmol/L (ref 134–144)

## 2020-06-12 LAB — CBC
Hematocrit: 44.1 % (ref 37.5–51.0)
Hemoglobin: 14.9 g/dL (ref 13.0–17.7)
MCH: 27.3 pg (ref 26.6–33.0)
MCHC: 33.8 g/dL (ref 31.5–35.7)
MCV: 81 fL (ref 79–97)
Platelets: 205 10*3/uL (ref 150–450)
RBC: 5.46 x10E6/uL (ref 4.14–5.80)
RDW: 15 % (ref 11.6–15.4)
WBC: 6.7 10*3/uL (ref 3.4–10.8)

## 2020-06-13 ENCOUNTER — Encounter: Payer: Self-pay | Admitting: Cardiology

## 2020-06-16 ENCOUNTER — Other Ambulatory Visit: Payer: Self-pay | Admitting: Registered Nurse

## 2020-06-25 ENCOUNTER — Other Ambulatory Visit: Payer: Self-pay

## 2020-06-25 ENCOUNTER — Ambulatory Visit
Admission: EM | Admit: 2020-06-25 | Discharge: 2020-06-25 | Disposition: A | Discharge status: Home / Self Care | Payer: 59

## 2020-06-25 DIAGNOSIS — M546 Pain in thoracic spine: Secondary | ICD-10-CM | POA: Diagnosis not present

## 2020-06-25 LAB — POCT URINALYSIS DIP (MANUAL ENTRY)
Bilirubin, UA: NEGATIVE
Blood, UA: NEGATIVE
Glucose, UA: NEGATIVE mg/dL
Leukocytes, UA: NEGATIVE
Nitrite, UA: NEGATIVE
Protein Ur, POC: NEGATIVE mg/dL
Spec Grav, UA: 1.025 (ref 1.010–1.025)
Urobilinogen, UA: 0.2 E.U./dL
pH, UA: 5.5 (ref 5.0–8.0)

## 2020-06-25 MED ORDER — TIZANIDINE HCL 2 MG PO TABS: 2.0000 mg | ORAL_TABLET | Freq: Four times a day (QID) | ORAL | 0 refills | Status: AC | PRN

## 2020-06-25 MED ORDER — PREDNISONE 10 MG (21) PO TBPK: ORAL_TABLET | Freq: Every day | ORAL | 0 refills | Status: DC

## 2020-06-25 NOTE — ED Triage Notes (Signed)
Pt c/o bilateral flank pain since Tuesday with some burning on urination. Pt also requesting muscle relaxer's for his muscle spasms in his neck, back, and legs.

## 2020-06-25 NOTE — ED Provider Notes (Signed)
[facility]    CSN: [id number] Arrival date & time: 06/25/20  1729      History   Chief Complaint Chief Complaint  Patient presents with  . Flank Pain    HPI Jerry Greene is a 60 y.o. male  presents for bilateral thoracic back pain since Tuesday.  States pain is achy, tight, sometimes has spasms.  Has tried muscle relaxers in the past with relief.  Denies trauma/injury to the affected area and does not recall an inciting event.  Denies fever, saddle area anesthesia, lower extremity numbness/weakness, urinary retention, fecal incontinence.  Patient also notes single episode of burning with urination: States it was very brief, happened a few days ago, has not recurred.  No penile discharge, testicular pain or swelling, concern for STI at this time.  Denies history of frequent UTI.  No change in diet, lifestyle, medications.  Past Medical History:  Diagnosis Date  . COVID-19 virus infection 05/15/2019  . Hyperlipidemia   . Hypertension     Patient Active Problem List   Diagnosis Date Noted  . COVID-19 virus infection 05/15/2019  . Essential hypertension 11/15/2018  . Hyperlipidemia 11/15/2018    History reviewed. No pertinent surgical history.     Home Medications    Prior to Admission medications   Medication Sig Start Date End Date Taking? Authorizing Provider  predniSONE (STERAPRED UNI-PAK 21 TAB) 10 MG (21) TBPK tablet Take by mouth daily. Take steroid taper as written 06/25/20  Yes Hall-Potvin, [place], PA-C  tiZANidine (ZANAFLEX) 2 MG tablet Take 1 tablet (2 mg total) by mouth every 6 (six) hours as needed for up to 5 days for muscle spasms. 06/25/20 06/30/20 Yes Hall-Potvin, [place], PA-C  amLODipine (NORVASC) 10 MG tablet Take 1 tablet (10 mg total) by mouth daily. 04/05/20   Janeece Agee, NP  losartan (COZAAR) 50 MG tablet Take 1 tablet (50 mg total) by mouth daily. 04/05/20   Janeece Agee, NP  sertraline (ZOLOFT) 50 MG tablet Take 1 daily for  anxiety. 06/11/20   Peyton Najjar, MD    Family History Family History  Problem Relation Age of Onset  . Diabetes Mother   . Diabetes Sister   . Diabetes Brother   . Colon cancer Neg Hx   . Esophageal cancer Neg Hx   . Rectal cancer Neg Hx   . Stomach cancer Neg Hx     Social History Social History   Tobacco Use  . Smoking status: Current Every Day Smoker    Packs/day: 0.65    Years: 40.00    Pack years: 26.00    Types: Cigarettes  . Smokeless tobacco: Never Used  . Tobacco comment: approxiamately 5-6 cigs/day  Vaping Use  . Vaping Use: Never used  Substance Use Topics  . Alcohol use: Yes    Alcohol/week: 3.0 standard drinks    Types: 3 Cans of beer per week  . Drug use: No     Allergies   Patient has no known allergies.   Review of Systems As per HPI   Physical Exam Triage Vital Signs ED Triage Vitals  Enc Vitals Group     BP 06/25/20 1750 (!) 143/96     Pulse Rate 06/25/20 1750 93     Resp 06/25/20 1750 20     Temp 06/25/20 1750 97.8 F (36.6 C)     Temp Source 06/25/20 1750 Oral     SpO2 06/25/20 1750 98 %     Weight --  Height --      Head Circumference --      Peak Flow --      Pain Score 06/25/20 1817 6     Pain Loc --      Pain Edu? --      Excl. in GC? --    No data found.  Updated Vital Signs BP (!) 143/96 (BP Location: Left Arm)   Pulse 93   Temp 97.8 F (36.6 C) (Oral)   Resp 20   SpO2 98%   Visual Acuity Right Eye Distance:   Left Eye Distance:   Bilateral Distance:    Right Eye Near:   Left Eye Near:    Bilateral Near:     Physical Exam Constitutional:      General: He is not in acute distress. HENT:     Head: Normocephalic and atraumatic.  Eyes:     General: No scleral icterus.    Pupils: Pupils are equal, round, and reactive to light.  Cardiovascular:     Rate and Rhythm: Normal rate.  Pulmonary:     Effort: Pulmonary effort is normal. No respiratory distress.     Breath sounds: No wheezing.   Musculoskeletal:       Back:     Comments: Diffuse, mild TTP without crepitus.  Negative CVA tenderness.  Tenderness more along latissimus dorsi bilaterally.  Skin:    Coloration: Skin is not jaundiced or pale.  Neurological:     Mental Status: He is alert and oriented to person, place, and time.      UC Treatments / Results  Labs (all labs ordered are listed, but only abnormal results are displayed) Labs Reviewed  POCT URINALYSIS DIP (MANUAL ENTRY) - Abnormal; Notable for the following components:      Result Value   Ketones, POC UA small (15) (*)    All other components within normal limits    EKG   Radiology No results found.  Procedures Procedures (including critical care time)  Medications Ordered in UC Medications - No data to display  Initial Impression / Assessment and Plan / UC Course  I have reviewed the triage vital signs and the nursing notes.  Pertinent labs & imaging results that were available during my care of the patient were reviewed by me and considered in my medical decision making (see chart for details).     Likely MSK in etiology: We'll treat supportively as below.  Patient declined STI testing at this time.  Will follow up with PCP for further evaluation management if needed.  Return precautions discussed, pt verbalized understanding and is agreeable to plan. Final Clinical Impressions(s) / UC Diagnoses   Final diagnoses:  Acute bilateral thoracic back pain   Discharge Instructions   None    ED Prescriptions    Medication Sig Dispense Auth. Provider   tiZANidine (ZANAFLEX) 2 MG tablet Take 1 tablet (2 mg total) by mouth every 6 (six) hours as needed for up to 5 days for muscle spasms. 20 tablet Hall-Potvin, [place], PA-C   predniSONE (STERAPRED UNI-PAK 21 TAB) 10 MG (21) TBPK tablet Take by mouth daily. Take steroid taper as written 21 tablet Hall-Potvin, [place], PA-C     PDMP not reviewed this encounter.   Hall-Potvin, [place],  [place] 06/26/20 1323

## 2020-08-05 ENCOUNTER — Other Ambulatory Visit: Payer: Self-pay | Admitting: Registered Nurse

## 2020-08-05 ENCOUNTER — Telehealth: Payer: Self-pay | Admitting: Registered Nurse

## 2020-08-05 DIAGNOSIS — I10 Essential (primary) hypertension: Secondary | ICD-10-CM

## 2020-08-05 DIAGNOSIS — N529 Male erectile dysfunction, unspecified: Secondary | ICD-10-CM

## 2020-08-05 MED ORDER — LOSARTAN POTASSIUM 50 MG PO TABS: 50.0000 mg | ORAL_TABLET | Freq: Every day | ORAL | 1 refills | Status: DC

## 2020-08-05 MED ORDER — AMLODIPINE BESYLATE 10 MG PO TABS: 10.0000 mg | ORAL_TABLET | Freq: Every day | ORAL | 1 refills | Status: DC

## 2020-08-05 MED ORDER — SILDENAFIL CITRATE 50 MG PO TABS: 25.0000 mg | ORAL_TABLET | Freq: Every day | ORAL | 0 refills | Status: AC | PRN

## 2020-08-05 NOTE — Telephone Encounter (Signed)
Patient called to follow up on Rx request for   sildenafil (VIAGRA) 50 MG tablet [[id number]] DISCONTINUED   Pharmacy:   [facility] [address] [place] - [address]  [address] [place], [place] [address]  Phone:  [phone] Fax:  [phone]   Patient stated he's been unable to get his Rx refilled and wants to know why. Please advise at [phone].

## 2020-08-05 NOTE — Telephone Encounter (Signed)
Patient walked in needs refills on  amLODipine (NORVASC) 10 MG tablet [[id number] And losartan (COZAAR) 50 MG tablet [[id number]]   Order Details Dose: 50 mg Route: Oral  Patient is down to last 2 pills on Amlodipine   And sildenafil    Sent to  [id number] - [place], [place] - [address]  [address] [place], [place] [address]  Phone:  [phone] Fax:  [phone]  DEA #:  --

## 2020-08-05 NOTE — Telephone Encounter (Signed)
Patient is requesting a refill of the following medications: Requested Prescriptions    No prescriptions requested or ordered in this encounter    Date of patient request: 08/05/20 Last office visit: 06/11/20 Date of last refill: 06/20/19 Last refill amount: 30 Follow up time period per chart:

## 2020-11-02 ENCOUNTER — Encounter (HOSPITAL_COMMUNITY): Payer: Self-pay | Admitting: Emergency Medicine

## 2020-11-02 ENCOUNTER — Ambulatory Visit (HOSPITAL_COMMUNITY)
Admission: EM | Admit: 2020-11-02 | Discharge: 2020-11-02 | Disposition: A | Discharge status: Home / Self Care | Payer: 59 | Attending: Physician Assistant | Admitting: Physician Assistant

## 2020-11-02 ENCOUNTER — Other Ambulatory Visit: Payer: Self-pay

## 2020-11-02 DIAGNOSIS — M545 Low back pain, unspecified: Secondary | ICD-10-CM

## 2020-11-02 MED ORDER — PREDNISONE 10 MG (21) PO TBPK
ORAL_TABLET | Freq: Every day | ORAL | 0 refills | Status: DC
Start: 2020-11-02 — End: 2021-06-10

## 2020-11-02 MED ORDER — METHOCARBAMOL 500 MG PO TABS
500.0000 mg | ORAL_TABLET | Freq: Three times a day (TID) | ORAL | 0 refills | Status: AC | PRN
Start: 2020-11-02 — End: ?

## 2020-11-02 NOTE — Discharge Instructions (Signed)
Prednisone as directed. Robaxin as needed, this can make you drowsy, so do not take if you are going to drive, operate heavy machinery, or make important decisions. Ice/heat compresses as needed. This can take up to 3-4 weeks to completely resolve, but you should be feeling better each week. Follow up with PCP/orthopedics if symptoms worsen, changes for reevaluation. If experience numbness/tingling of the inner thighs, loss of bladder or bowel control, go to the emergency department for evaluation.   

## 2020-11-02 NOTE — ED Provider Notes (Signed)
[facility]    CSN: [id number] Arrival date & time: 11/02/20  1742      History   Chief Complaint Chief Complaint  Patient presents with  . Back Pain    HPI Jerry Greene is a 60 y.o. male.   59 year old male comes in for few day history of low back pain. Denies injury/trauma. States was reaching over for something when he felt the pain. Pain is to bilateral lower back, worse with certain movements/positioning. Denies radiation of pain, numbness/tingling. Denies saddle anesthesia, loss of bladder or bowel control. Denies leg weakness. Ibuprofen 400mg  without relief.     Past Medical History:  Diagnosis Date  . COVID-19 virus infection 05/15/2019  . Hyperlipidemia   . Hypertension     Patient Active Problem List   Diagnosis Date Noted  . COVID-19 virus infection 05/15/2019  . Essential hypertension 11/15/2018  . Hyperlipidemia 11/15/2018    History reviewed. No pertinent surgical history.     Home Medications    Prior to Admission medications   Medication Sig Start Date End Date Taking? Authorizing Provider  amLODipine (NORVASC) 10 MG tablet Take 1 tablet (10 mg total) by mouth daily. 08/05/20  Yes 10/05/20, NP  losartan (COZAAR) 50 MG tablet Take 1 tablet (50 mg total) by mouth daily. 08/05/20  Yes 10/05/20, NP  methocarbamol (ROBAXIN) 500 MG tablet Take 1 tablet (500 mg total) by mouth every 8 (eight) hours as needed for muscle spasms. 11/02/20  Yes Anetta Olvera V, PA-C  predniSONE (STERAPRED UNI-PAK 21 TAB) 10 MG (21) TBPK tablet Take by mouth daily. Take 6 tabs by mouth day 1, then 5 tabs, then 4 tabs, then 3 tabs, 2 tabs, then 1 tab for the last day 11/02/20  Yes Renn Dirocco V, PA-C  sertraline (ZOLOFT) 50 MG tablet Take 1 daily for anxiety. Patient not taking: Reported on 11/02/2020 06/11/20   06/13/20, MD  sildenafil (VIAGRA) 50 MG tablet Take 0.5-1 tablets (25-50 mg total) by mouth daily as needed for erectile dysfunction. 08/05/20   10/05/20, NP    Family History Family History  Problem Relation Age of Onset  . Diabetes Mother   . Diabetes Sister   . Diabetes Brother   . Colon cancer Neg Hx   . Esophageal cancer Neg Hx   . Rectal cancer Neg Hx   . Stomach cancer Neg Hx     Social History Social History   Tobacco Use  . Smoking status: Current Every Day Smoker    Packs/day: 0.65    Years: 40.00    Pack years: 26.00    Types: Cigarettes  . Smokeless tobacco: Never Used  . Tobacco comment: approxiamately 5-6 cigs/day  Vaping Use  . Vaping Use: Never used  Substance Use Topics  . Alcohol use: Yes    Alcohol/week: 3.0 standard drinks    Types: 3 Cans of beer per week  . Drug use: No     Allergies   Patient has no known allergies.   Review of Systems Review of Systems  Reason unable to perform ROS: See HPI as above.     Physical Exam Triage Vital Signs ED Triage Vitals  Enc Vitals Group     BP 11/02/20 1831 128/89     Pulse Rate 11/02/20 1831 95     Resp 11/02/20 1831 20     Temp 11/02/20 1831 99.1 F (37.3 C)     Temp Source 11/02/20 1831 Oral  SpO2 11/02/20 1831 98 %     Weight --      Height --      Head Circumference --      Peak Flow --      Pain Score 11/02/20 1828 7     Pain Loc --      Pain Edu? --      Excl. in GC? --    No data found.  Updated Vital Signs BP 128/89 (BP Location: Left Arm)   Pulse 95   Temp 99.1 F (37.3 C) (Oral)   Resp 20   SpO2 98%   Physical Exam Constitutional:      General: He is not in acute distress.    Appearance: He is well-developed. He is not diaphoretic.  HENT:     Head: Normocephalic and atraumatic.  Eyes:     Conjunctiva/sclera: Conjunctivae normal.     Pupils: Pupils are equal, round, and reactive to light.  Cardiovascular:     Rate and Rhythm: Normal rate and regular rhythm.     Heart sounds: Normal heart sounds. No murmur heard. No friction rub. No gallop.   Pulmonary:     Effort: Pulmonary effort is normal. No  accessory muscle usage or respiratory distress.     Breath sounds: Normal breath sounds. No stridor. No decreased breath sounds, wheezing, rhonchi or rales.  Musculoskeletal:     Comments: No tenderness on palpation of the spinous processes. Patient points to bilateral lumbar region for pain. This is not reproduced with palpation. More reproduced during ROM. FROM of back and hips. Strength normal and equal bilaterally. Sensation intact and equal bilaterally. Negative SLR.   Skin:    General: Skin is warm and dry.  Neurological:     Mental Status: He is alert and oriented to person, place, and time.      UC Treatments / Results  Labs (all labs ordered are listed, but only abnormal results are displayed) Labs Reviewed - No data to display  EKG   Radiology No results found.  Procedures Procedures (including critical care time)  Medications Ordered in UC Medications - No data to display  Initial Impression / Assessment and Plan / UC Course  I have reviewed the triage vital signs and the nursing notes.  Pertinent labs & imaging results that were available during my care of the patient were reviewed by me and considered in my medical decision making (see chart for details).  Patient states has had good improvement with similar symptoms using prednisone in the past. Last course 5 months ago. Did discuss would be hesitant for frequent use of prednisone despite lack of DM history. Given last course 5 months ago, do think it is okay to use for current symptoms. To start course as directed. Muscle relaxant as needed. Ice/heat compresses. Expected course of healing discussed. Return precautions given.   Final Clinical Impressions(s) / UC Diagnoses   Final diagnoses:  Acute bilateral low back pain without sciatica    ED Prescriptions    Medication Sig Dispense Auth. Provider   predniSONE (STERAPRED UNI-PAK 21 TAB) 10 MG (21) TBPK tablet Take by mouth daily. Take 6 tabs by mouth day 1,  then 5 tabs, then 4 tabs, then 3 tabs, 2 tabs, then 1 tab for the last day 21 tablet Kylle Lall V, PA-C   methocarbamol (ROBAXIN) 500 MG tablet Take 1 tablet (500 mg total) by mouth every 8 (eight) hours as needed for muscle spasms. 21 tablet Cathie Hoops, [place]  V, PA-C     PDMP not reviewed this encounter.   Belinda Fisher, PA-C 11/02/20 1908

## 2020-11-02 NOTE — ED Triage Notes (Signed)
Patient has lower back pain.  Patient reports this episode started over the weekend.  Patient requesting a work note

## 2020-11-24 ENCOUNTER — Telehealth: Payer: Self-pay | Admitting: Registered Nurse

## 2020-11-24 NOTE — Telephone Encounter (Signed)
   Patient is requesting to [facility] to Dr. Alvy Bimler because of location. Please advise

## 2020-11-24 NOTE — Telephone Encounter (Signed)
Okay with me. Thanks 

## 2020-12-08 NOTE — Telephone Encounter (Signed)
Called pt, LVM.   

## 2021-02-12 ENCOUNTER — Other Ambulatory Visit: Payer: Self-pay | Admitting: Registered Nurse

## 2021-02-12 DIAGNOSIS — I10 Essential (primary) hypertension: Secondary | ICD-10-CM

## 2021-03-16 IMAGING — DX DG CHEST 2V
3 series · 3 of 3 positions shown · non-contrast
Comparison: Chest radiograph dated 06/15/2017.

CLINICAL DATA: 58-year-old male with left chest wall pain.

EXAM:
CHEST - 2 VIEW

[chest pa (1 of 2)]
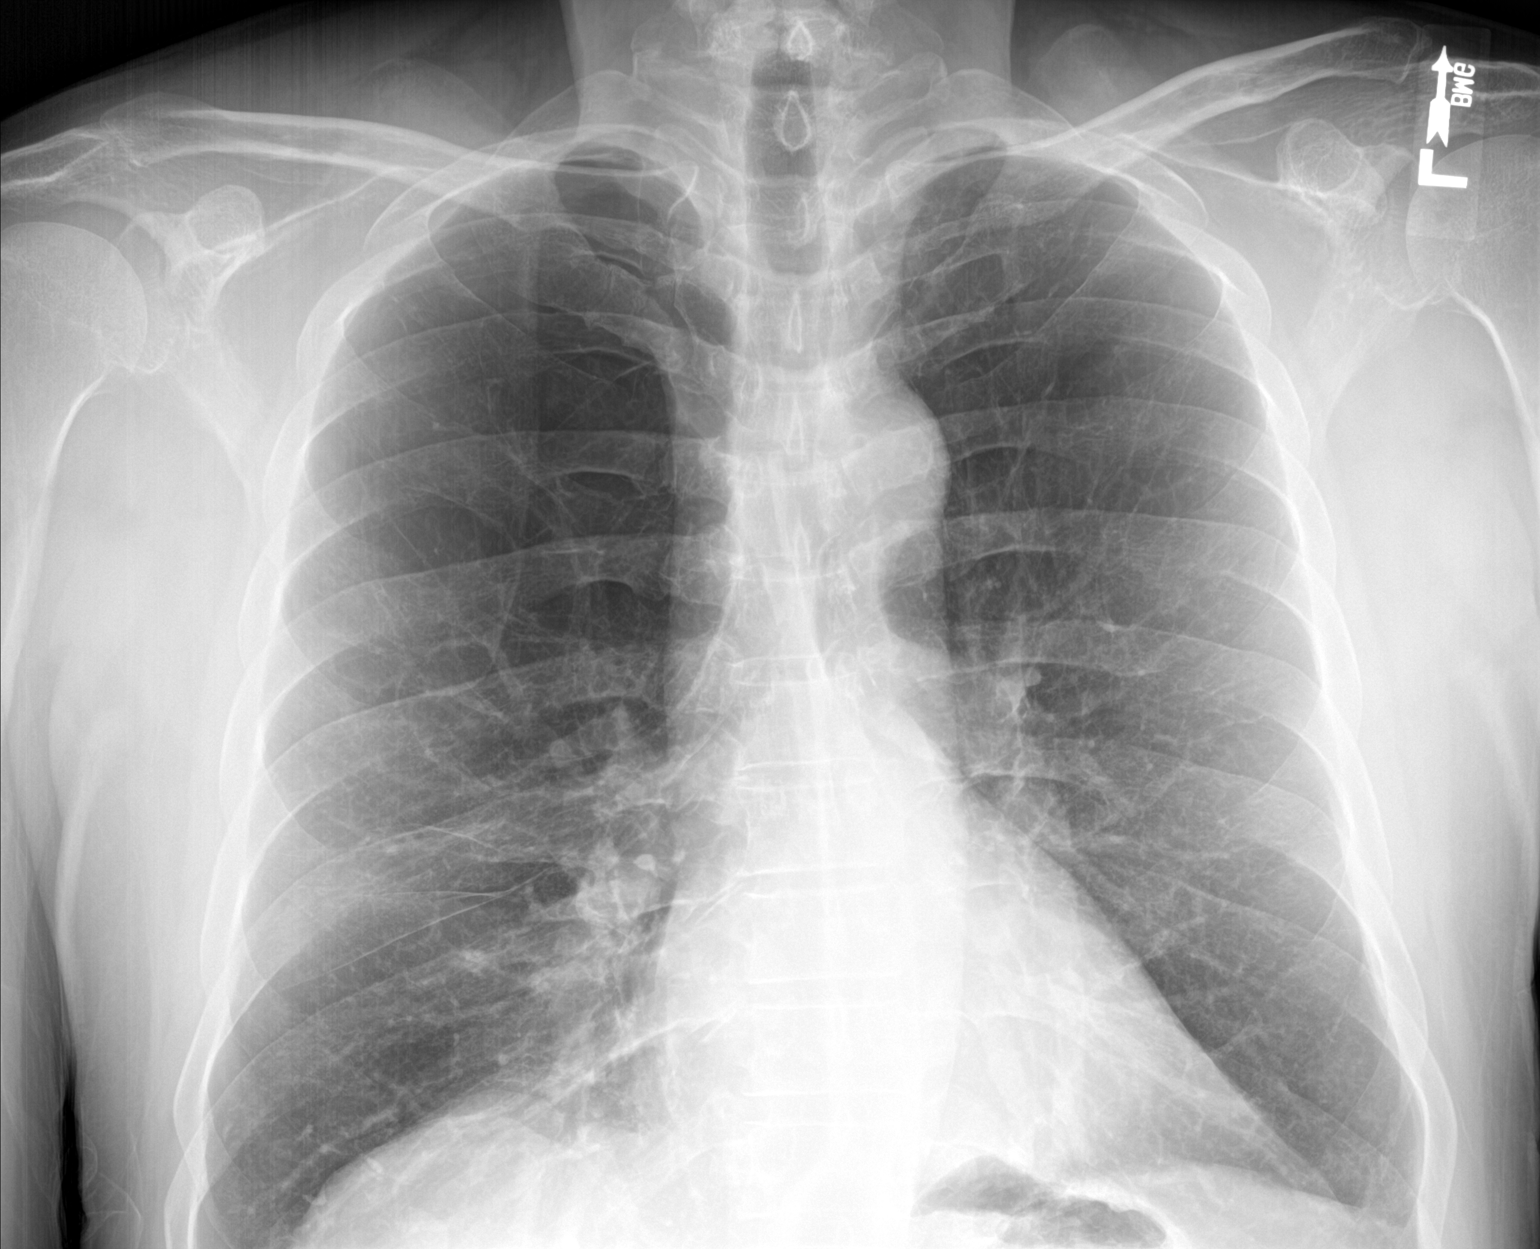

[chest lat]
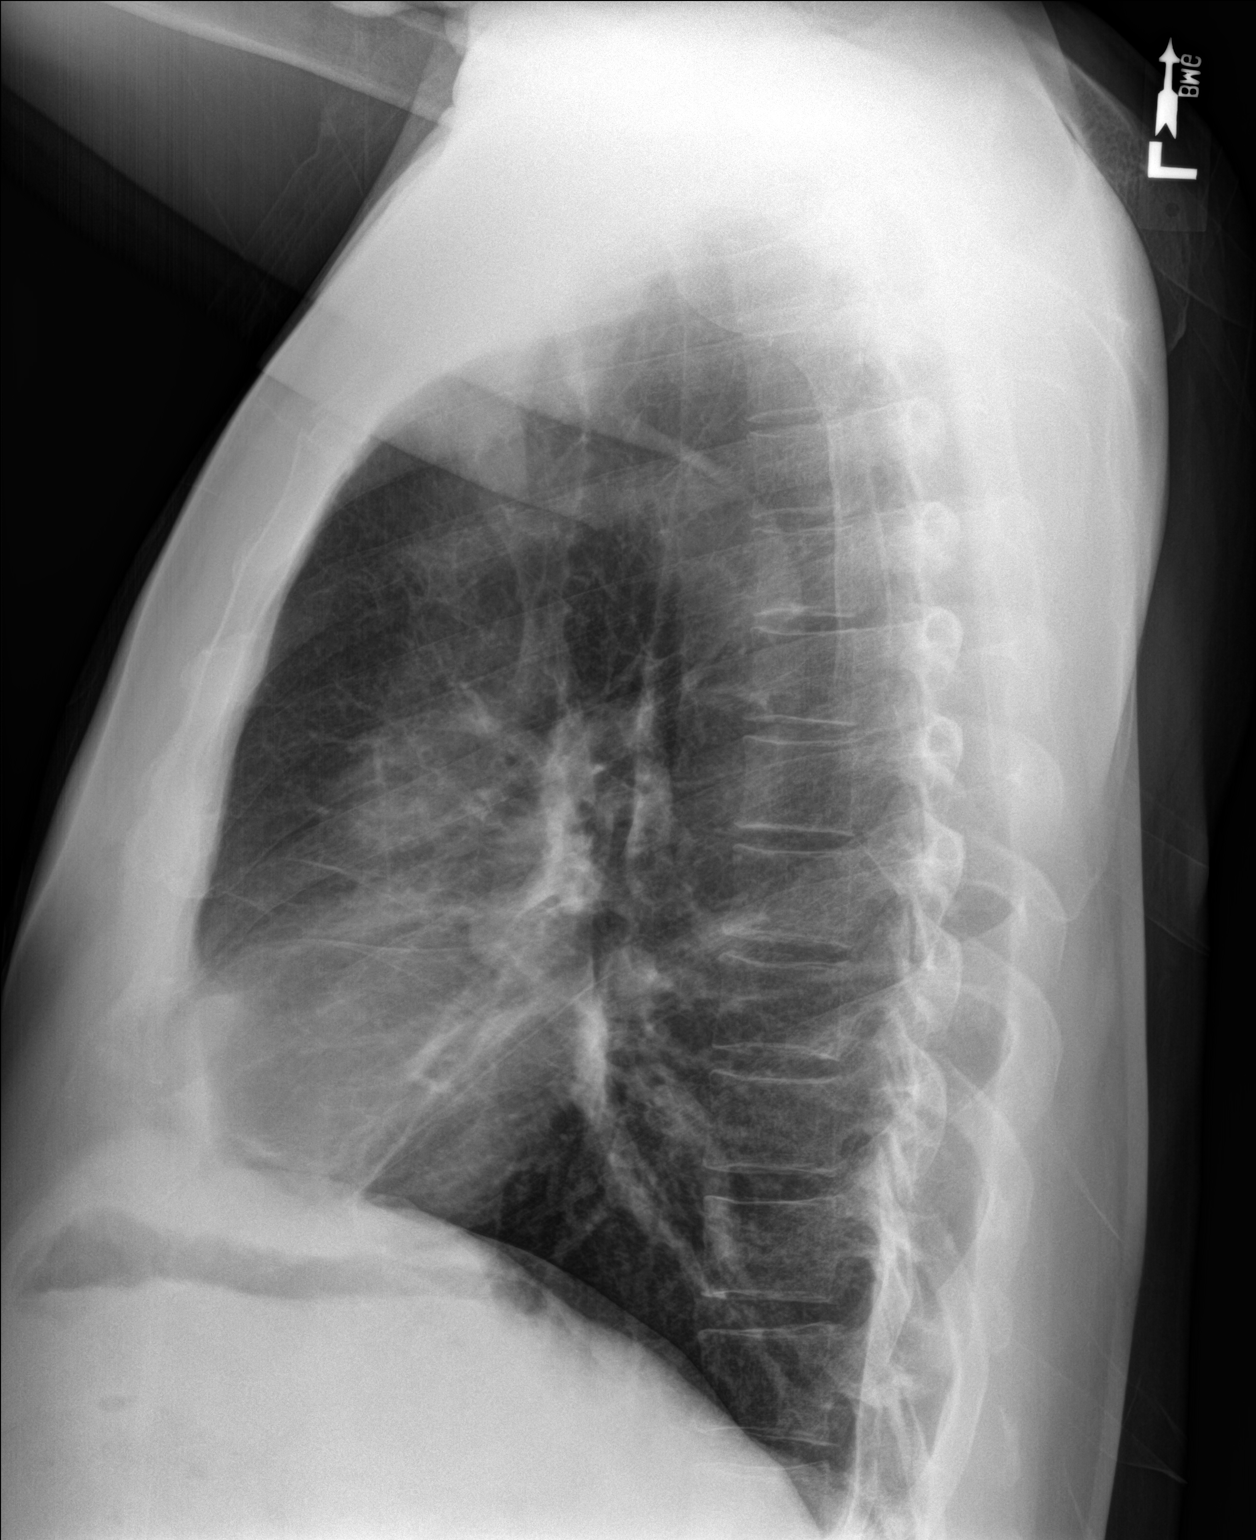

[chest pa (2 of 2)]
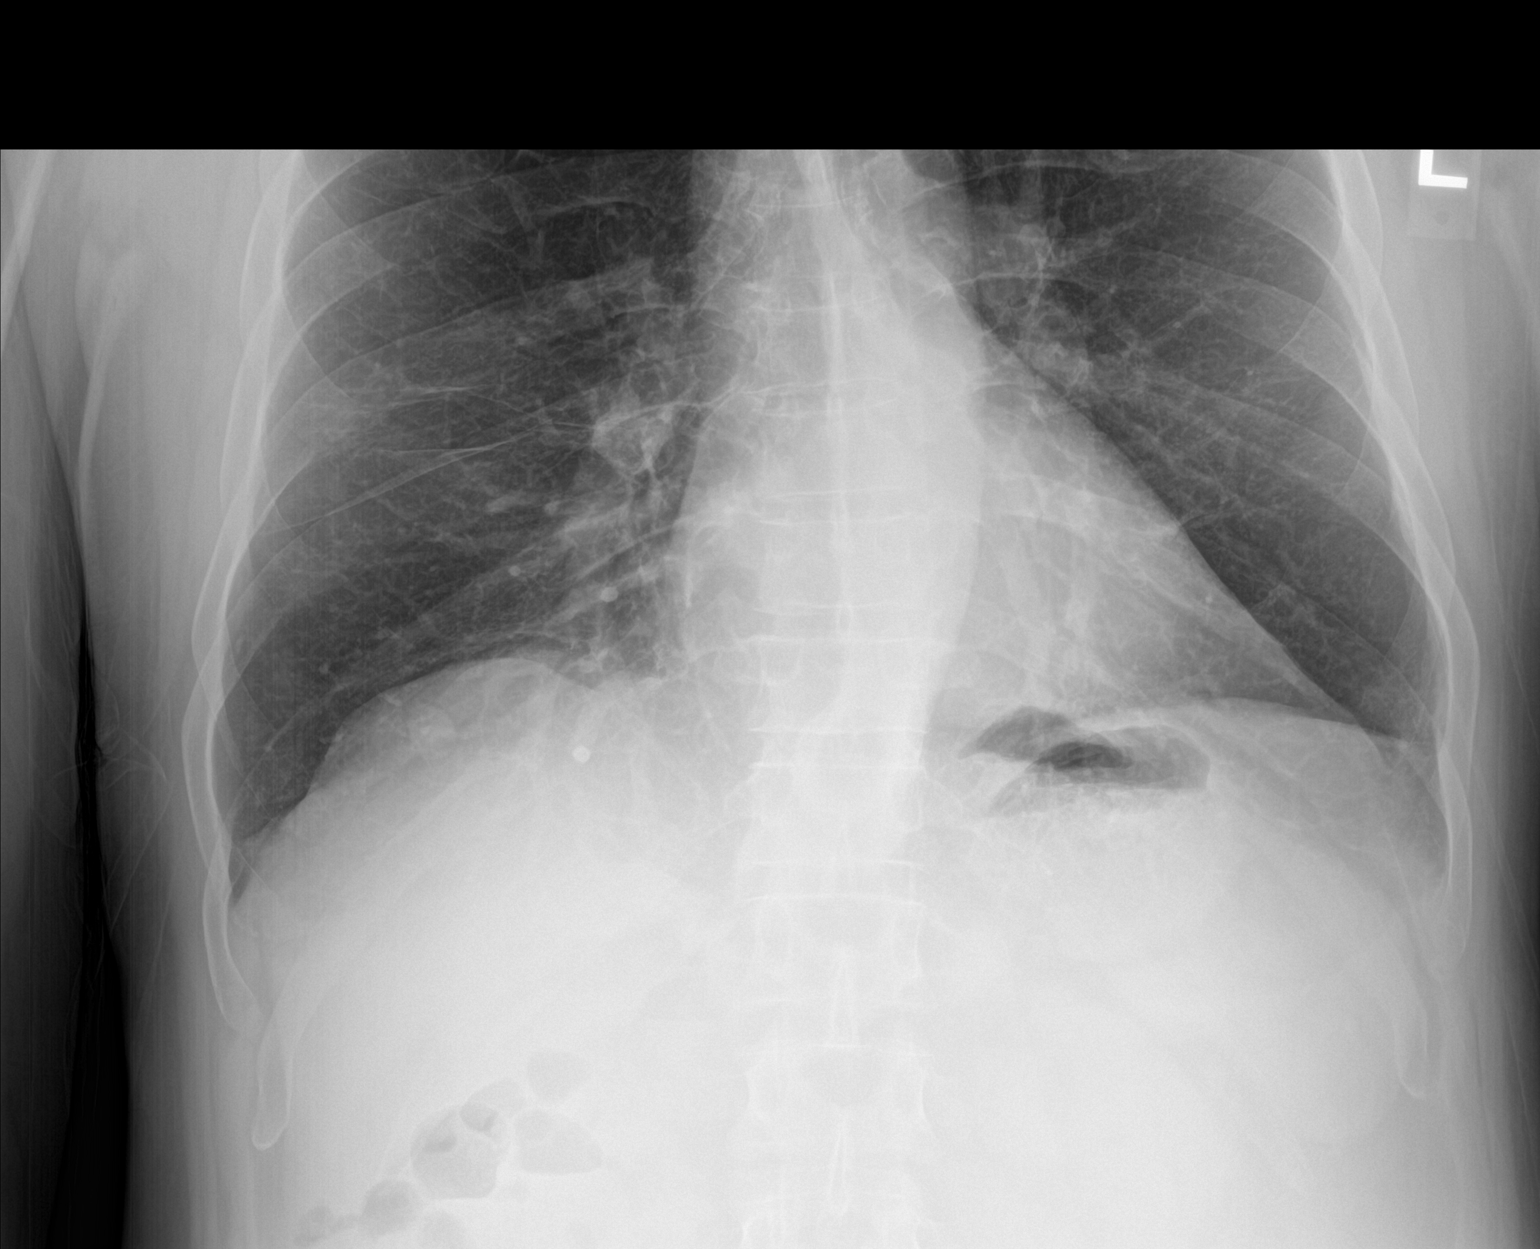

[3 of 3 positions shown; findings below may reference images not displayed]

FINDINGS: There is background of emphysema. No focal consolidation, pleural
effusion, or pneumothorax. There is a 15 mm nodular density at the
right lung base. This can be better evaluated with chest CT on a
nonemergent/outpatient basis. The cardiac silhouette is within
limits. Atherosclerotic calcification of the aorta. No acute osseous
pathology.
IMPRESSION: 1. No acute cardiopulmonary process.
2. Emphysema.
3. A 15 mm right lung base nodule, present on the prior radiograph.
Follow-up with CT on a nonemergent/outpatient basis recommended.

## 2021-03-17 ENCOUNTER — Ambulatory Visit: Payer: 59 | Admitting: Emergency Medicine

## 2021-04-13 ENCOUNTER — Other Ambulatory Visit: Payer: Self-pay | Admitting: Registered Nurse

## 2021-04-13 DIAGNOSIS — I10 Essential (primary) hypertension: Secondary | ICD-10-CM

## 2021-05-12 ENCOUNTER — Ambulatory Visit (HOSPITAL_BASED_OUTPATIENT_CLINIC_OR_DEPARTMENT_OTHER): Payer: Self-pay | Admitting: Cardiology

## 2021-05-25 ENCOUNTER — Telehealth: Payer: Self-pay | Admitting: Registered Nurse

## 2021-05-25 ENCOUNTER — Other Ambulatory Visit: Payer: Self-pay

## 2021-05-25 DIAGNOSIS — I10 Essential (primary) hypertension: Secondary | ICD-10-CM

## 2021-05-25 MED ORDER — LOSARTAN POTASSIUM 50 MG PO TABS: 50.0000 mg | ORAL_TABLET | Freq: Every day | ORAL | 0 refills | Status: AC

## 2021-05-25 MED ORDER — AMLODIPINE BESYLATE 10 MG PO TABS: 10.0000 mg | ORAL_TABLET | Freq: Every day | ORAL | 0 refills | Status: AC

## 2021-05-25 NOTE — Telephone Encounter (Signed)
Rx sent to pharmacy   

## 2021-05-25 NOTE — Telephone Encounter (Signed)
Pt called in asking for a refill on the Amlodpine and Losartan. Pt uses [facility] on [address].  Next appt with Gerlene Burdock is scheduled for 06/03/21    Please advise

## 2021-06-03 ENCOUNTER — Ambulatory Visit: Payer: Self-pay | Admitting: Registered Nurse

## 2021-06-10 ENCOUNTER — Encounter (HOSPITAL_BASED_OUTPATIENT_CLINIC_OR_DEPARTMENT_OTHER): Payer: Self-pay | Admitting: Cardiology

## 2021-06-10 ENCOUNTER — Other Ambulatory Visit: Payer: Self-pay

## 2021-06-10 ENCOUNTER — Ambulatory Visit (HOSPITAL_BASED_OUTPATIENT_CLINIC_OR_DEPARTMENT_OTHER): Payer: Managed Care, Other (non HMO) | Admitting: Cardiology

## 2021-06-10 VITALS — BP 142/86 | HR 104 | Ht 72.0 in | Wt 234.8 lb

## 2021-06-10 DIAGNOSIS — Z7182 Exercise counseling: Secondary | ICD-10-CM

## 2021-06-10 DIAGNOSIS — I10 Essential (primary) hypertension: Secondary | ICD-10-CM

## 2021-06-10 DIAGNOSIS — E7800 Pure hypercholesterolemia, unspecified: Secondary | ICD-10-CM

## 2021-06-10 DIAGNOSIS — R0683 Snoring: Secondary | ICD-10-CM

## 2021-06-10 DIAGNOSIS — Z716 Tobacco abuse counseling: Secondary | ICD-10-CM | POA: Diagnosis not present

## 2021-06-10 DIAGNOSIS — Z713 Dietary counseling and surveillance: Secondary | ICD-10-CM | POA: Diagnosis not present

## 2021-06-10 DIAGNOSIS — Z7189 Other specified counseling: Secondary | ICD-10-CM

## 2021-06-10 NOTE — Patient Instructions (Signed)
Medication Instructions:  Your Physician recommend you continue on your current medication as directed.    *If you need a refill on your cardiac medications before your next appointment, please call your pharmacy*   Lab Work: None ordered today   Testing/Procedures: Your physician has recommended that you have a home sleep study. This test records several body functions during sleep, including: brain activity, eye movement, oxygen and carbon dioxide blood levels, heart rate and rhythm, breathing rate and rhythm, the flow of air through your mouth and nose, snoring, body muscle movements, and chest and belly movement.    Follow-Up: At [facility], you and your health needs are our priority.  As part of our continuing mission to provide you with exceptional heart care, we have created designated Provider Care Teams.  These Care Teams include your primary Cardiologist (physician) and Advanced Practice Providers (APPs -  Physician Assistants and Nurse Practitioners) who all work together to provide you with the care you need, when you need it.  We recommend signing up for the patient portal called "MyChart".  Sign up information is provided on this After Visit Summary.  MyChart is used to connect with patients for Virtual Visits (Telemedicine).  Patients are able to view lab/test results, encounter notes, upcoming appointments, etc.  Non-urgent messages can be sent to your provider as well.   To learn more about what you can do with MyChart, go to [email or web address].    Your next appointment:   6 month(s)  The format for your next appointment:   In Person  Provider:   Buford Dresser, MD

## 2021-06-10 NOTE — Progress Notes (Signed)
Cardiology Office Note:    Date:  06/10/2021   ID:  Jerry Greene, DOB 16-Oct-1960, MRN [id number]  PCP:  Janeece Agee, NP  Cardiologist:  Jodelle Red, MD  Referring MD: Janeece Agee, NP   CC: follow up  History of Present Illness:    Jerry Greene is a 61 y.o. male with a hx of hypertension, tobacco use who is seen for follow up today. I initially met him 09/26/19 as a new consult at the request of Janeece Agee, NP for the evaluation and management of chest pain.  Cardiac history: seen for chest pain, ETT 10/17/19 normal. CV risk: tobacco use, hypertension  Today: He is accompanied by his wife. Overall, he is feeling alright.   For a while he did not have recurrent cramping sensations. However, on the way to clinic today, he developed a muscle spasm/cramping feeling in his left side while he was turning to his left. Previously his muscle spasms would occur randomly, but his most severe episodes usually occur at night.  Also, he is suffering from insomnia. He wakes up every morning around 2 AM, and is unable to return to sleep until 6 AM. His wife notes that he does snore, and confirms that he has dyspneic pauses during the night, followed by a large snore. Sometimes he wakes up tired or with a headache in the morning. He will fall asleep easily during the day. Occasionally he will try taking OTC Tylenol PM. He has never had a sleep study.  Lately his most strenuous activity has been walking to the [employer] or mailbox. We discussed at length strategies for increasing his exercise through walking, and the benefits of obtaining a treadmill in the long-term.  Concerning his diet, he does not add salt to his meals.  He is smoking about 1/2 ppd. He has tried to quit unsuccessfully, but plans to keep trying. He may go a few days without smoking.   He denies any palpitations, chest pain, or shortness of breath. No lightheadedness, headaches, syncope, lower extremity edema or  exertional symptoms.  Past Medical History:  Diagnosis Date   COVID-19 virus infection 05/15/2019   Hyperlipidemia    Hypertension     No past surgical history on file.  Current Medications: Current Outpatient Medications on File Prior to Visit  Medication Sig   amLODipine (NORVASC) 10 MG tablet Take 1 tablet (10 mg total) by mouth daily.   losartan (COZAAR) 50 MG tablet Take 1 tablet (50 mg total) by mouth daily.   sertraline (ZOLOFT) 50 MG tablet Take 1 daily for anxiety.   methocarbamol (ROBAXIN) 500 MG tablet Take 1 tablet (500 mg total) by mouth every 8 (eight) hours as needed for muscle spasms. (Patient not taking: Reported on 06/10/2021)   predniSONE (STERAPRED UNI-PAK 21 TAB) 10 MG (21) TBPK tablet Take by mouth daily. Take 6 tabs by mouth day 1, then 5 tabs, then 4 tabs, then 3 tabs, 2 tabs, then 1 tab for the last day (Patient not taking: Reported on 06/10/2021)   sildenafil (VIAGRA) 50 MG tablet Take 0.5-1 tablets (25-50 mg total) by mouth daily as needed for erectile dysfunction. (Patient not taking: Reported on 06/10/2021)   No current facility-administered medications on file prior to visit.     Allergies:   Patient has no known allergies.   Social History   Tobacco Use   Smoking status: Every Day    Packs/day: 0.65    Years: 40.00    Pack years: 26.00  Types: Cigarettes   Smokeless tobacco: Never   Tobacco comments:    approxiamately 5-6 cigs/day  Vaping Use   Vaping Use: Never used  Substance Use Topics   Alcohol use: Yes    Alcohol/week: 3.0 standard drinks    Types: 3 Cans of beer per week   Drug use: No    Family History: family history includes Diabetes in his brother, mother, and sister. There is no history of Colon cancer, Esophageal cancer, Rectal cancer, or Stomach cancer.  ROS:   Please see the history of present illness.   (+) Muscle spasm/cramp (+) Insomnia (+) Snoring Additional pertinent ROS otherwise unremarkable.   EKGs/Labs/Other  Studies Reviewed:    The following studies were reviewed today:  ETT 10/17/19 Blood pressure demonstrated a normal response to exercise. Upsloping ST segment depression ST segment depression was noted during stress.   Negative adequate stress test.   EKG:  EKG is personally reviewed.   06/10/2021: sinus tachycardia at 104 bpm, nonspecific t wave pattern 09/26/19: NSR at 79 bpm, nonspecific t wave flattening  Recent Labs: 06/11/2020: BUN 11; Creatinine, Ser 1.36; Hemoglobin 14.9; Platelets 205; Potassium 4.1; Sodium 141; TSH 0.662   Recent Lipid Panel    Component Value Date/Time   CHOL 195 11/15/2018 1605   TRIG 114 11/15/2018 1605   HDL 39 (L) 11/15/2018 1605   CHOLHDL 5.0 11/15/2018 1605   LDLCALC 133 (H) 11/15/2018 1605    Physical Exam:    VS:  BP (!) 142/86 (BP Location: Right Arm, Patient Position: Sitting, Cuff Size: Large)    Pulse (!) 104    Ht 6' (1.829 m)    Wt 234 lb 12.8 oz (106.5 kg)    SpO2 97%    BMI 31.84 kg/m     Wt Readings from Last 3 Encounters:  06/10/21 234 lb 12.8 oz (106.5 kg)  06/11/20 226 lb 3.2 oz (102.6 kg)  04/02/20 224 lb 12.8 oz (102 kg)    GEN: Well nourished, well developed in no acute distress HEENT: Normal, moist mucous membranes NECK: No JVD CARDIAC: tachycardic, regular rhythm, normal S1 and S2, no rubs or gallops. No murmur. VASCULAR: Radial and DP pulses 2+ bilaterally. No carotid bruits RESPIRATORY:  Clear to auscultation without rales, wheezing or rhonchi  ABDOMEN: Soft, non-tender, non-distended MUSCULOSKELETAL:  Ambulates independently SKIN: Warm and dry, no edema NEUROLOGIC:  Alert and oriented x 3. No focal neuro deficits noted. PSYCHIATRIC:  Normal affect   ASSESSMENT:    1. Snoring   2. Essential hypertension   3. Nutritional counseling   4. Exercise counseling   5. Cardiac risk counseling   6. Pure hypercholesterolemia   7. Tobacco abuse counseling     PLAN:    Chest pain: -atypical symptoms, related to  movement/reaching -normal ETT prior -suspect noncardiac in etiology, but discussed red flag warning signs that need immediate medical attention  Hypertension: elevated today -continue amlodipine, losartan -reviewed how to monitor BP -if becomes elevated, would switch losartan to valsartan or add chlorthalidone -wants to work on lifestyle, see below  Snoring Daytime fatigue AM headaches -will get sleep study, preferably home study -would benefit from CPAP if OSA found  Tobacco use and counseling: The patient was counseled on tobacco cessation today for 6 minutes.  Counseling included reviewing the risks of smoking tobacco products, how it impacts the patient's current medical diagnoses and different strategies for quitting.  Pharmacotherapy to aid in tobacco cessation was not prescribed today.   Hypercholesterolemia: -LDL 133 -  with elevated ASCVD risk, would recommend a statin -we have discussed, he would like to work on lifestyle first  We discussed diet and exercise recommendations at length today. Cardiac risk counseling and prevention recommendations: -recommend heart healthy/Mediterranean diet, with whole grains, fruits, vegetable, fish, lean meats, nuts, and olive oil. Limit salt. -recommend moderate walking, 3-5 times/week for 30-50 minutes each session. Aim for at least 150 minutes.week. Goal should be pace of 3 miles/hours, or walking 1.5 miles in 30 minutes -recommend avoidance of tobacco products. Avoid excess alcohol. -ASCVD risk score: The 10-year ASCVD risk score (Arnett DK, et al., 2019) is: 27.8%   Values used to calculate the score:     Age: 76 years     Sex: Male     Is Non-Hispanic African American: Yes     Diabetic: No     Tobacco smoker: Yes     Systolic Blood Pressure: 544 mmHg     Is BP treated: Yes     HDL Cholesterol: 39 mg/dL     Total Cholesterol: 195 mg/dL    Plan for follow up: 6 months or sooner as needed  Buford Dresser, MD, PhD [facility]   [facility]    Medication Adjustments/Labs and Tests Ordered: Current medicines are reviewed at length with the patient today.  Concerns regarding medicines are outlined above.   Orders Placed This Encounter  Procedures   EKG 12-Lead   Home sleep test   No orders of the defined types were placed in this encounter.  Patient Instructions  Medication Instructions:  Your Physician recommend you continue on your current medication as directed.    *If you need a refill on your cardiac medications before your next appointment, please call your pharmacy*   Lab Work: None ordered today   Testing/Procedures: Your physician has recommended that you have a home sleep study. This test records several body functions during sleep, including: brain activity, eye movement, oxygen and carbon dioxide blood levels, heart rate and rhythm, breathing rate and rhythm, the flow of air through your mouth and nose, snoring, body muscle movements, and chest and belly movement.    Follow-Up: At [facility], you and your health needs are our priority.  As part of our continuing mission to provide you with exceptional heart care, we have created designated Provider Care Teams.  These Care Teams include your primary Cardiologist (physician) and Advanced Practice Providers (APPs -  Physician Assistants and Nurse Practitioners) who all work together to provide you with the care you need, when you need it.  We recommend signing up for the patient portal called "MyChart".  Sign up information is provided on this After Visit Summary.  MyChart is used to connect with patients for Virtual Visits (Telemedicine).  Patients are able to view lab/test results, encounter notes, upcoming appointments, etc.  Non-urgent messages can be sent to your provider as well.   To learn more about what you can do with MyChart, go to [email or web address].    Your next appointment:   6 month(s)  The format for your next  appointment:   In Person  Provider:   Buford Dresser, MD        [facility],acting as a scribe for Buford Dresser, MD.,have documented all relevant documentation on the behalf of Buford Dresser, MD,as directed by  Buford Dresser, MD while in the presence of Buford Dresser, MD.  I, Buford Dresser, MD, have reviewed all documentation for this visit. The documentation on 06/10/21 for the  exam, diagnosis, procedures, and orders are all accurate and complete.   Signed, Buford Dresser, MD PhD 06/10/2021   [place]

## 2021-09-21 ENCOUNTER — Telehealth: Payer: Self-pay | Admitting: *Deleted

## 2021-09-21 NOTE — Telephone Encounter (Signed)
Prior Authorization for HST sent to [facility]) via web portal. Per web portal no PA is required. ?

## 2021-10-03 NOTE — Telephone Encounter (Signed)
NA

## 2021-11-24 NOTE — Telephone Encounter (Signed)
NOTE NOT NEEDED ?

## 2022-03-20 ENCOUNTER — Encounter (HOSPITAL_BASED_OUTPATIENT_CLINIC_OR_DEPARTMENT_OTHER): Payer: Self-pay | Admitting: Cardiology

## 2022-05-05 ENCOUNTER — Ambulatory Visit (HOSPITAL_BASED_OUTPATIENT_CLINIC_OR_DEPARTMENT_OTHER): Payer: Managed Care, Other (non HMO) | Admitting: Cardiology

## 2022-05-05 ENCOUNTER — Encounter (HOSPITAL_BASED_OUTPATIENT_CLINIC_OR_DEPARTMENT_OTHER): Payer: Self-pay | Admitting: Cardiology

## 2022-05-05 VITALS — BP 118/88 | HR 106 | Ht 72.0 in

## 2022-05-05 DIAGNOSIS — I10 Essential (primary) hypertension: Secondary | ICD-10-CM | POA: Diagnosis not present

## 2022-05-05 DIAGNOSIS — R079 Chest pain, unspecified: Secondary | ICD-10-CM | POA: Diagnosis not present

## 2022-05-05 DIAGNOSIS — R9431 Abnormal electrocardiogram [ECG] [EKG]: Secondary | ICD-10-CM | POA: Diagnosis not present

## 2022-05-05 DIAGNOSIS — Z716 Tobacco abuse counseling: Secondary | ICD-10-CM

## 2022-05-05 NOTE — Progress Notes (Signed)
Cardiology Office Note:    Date:  05/05/2022   ID:  Jerry Greene, DOB 10-27-1960, MRN [id number]  PCP:  Maximiano Coss, NP  Cardiologist:  Buford Dresser, MD  Referring MD: Maximiano Coss, NP   CC: follow up  History of Present Illness:    Jerry Greene is a 62 y.o. male with a hx of hypertension, tobacco use who is seen for follow up today. I initially met him 09/26/19 as a new consult at the request of Maximiano Coss, NP for the evaluation and management of chest pain.  Cardiac history: seen for chest pain, ETT 10/17/19 normal. CV risk: tobacco use, hypertension  At his last appointment, he was accompanied by his wife. Overall, he was feeling alright. For a while he did not have recurrent cramping sensations. However, on the way to clinic, he developed a muscle spasm/cramping feeling in his left side while he was turning to his left. Previously his muscle spasms would occur randomly, but his most severe episodes usually occurred at night.  Also, he was suffering from insomnia. He would wake up every morning around 2 AM, and was unable to return to sleep until 6 AM. His wife noted that he does snore, and confirmed that he has dyspneic pauses during the night, followed by a large snore. Sometimes he wakes up tired or with a headache in the morning. He falls asleep easily during the day. Occasionally he would try taking OTC Tylenol PM. He has never had a sleep study.  Lately his most strenuous activity has been walking to the [employer] or mailbox. We discussed at length strategies for increasing his exercise through walking, and the benefits of obtaining a treadmill in the long-term.  Concerning his diet, he does not add salt to his meals.  He was smoking about 1/2 ppd. He has tried to quit unsuccessfully, but plans to keep trying. He may go a few days without smoking.   Today:   He is accompanied to his visit. He appears well.  He states that he has been experiencing sharp  chest pain that occurs at least once a day. He endorses this chest pain will happen at random.  Additionally, he mentions that when he lays down at night he will feel his heart beating noticeably.   He is working on quitting smoking and is down to two cigarettes per day. He has been taking wellbutrin twice a day to help with his smoking and endorses it has been helping.   He has been taking an over the counter tylenol PM pill to help with his sleep.   He denies any shortness of breath or peripheral edema. No lightheadedness, headaches, syncope, orthopnea, or PND.  Past Medical History:  Diagnosis Date   COVID-19 virus infection 05/15/2019   Hyperlipidemia    Hypertension     No past surgical history on file.  Current Medications: Current Outpatient Medications on File Prior to Visit  Medication Sig   amLODipine (NORVASC) 10 MG tablet Take 1 tablet (10 mg total) by mouth daily.   losartan (COZAAR) 50 MG tablet Take 1 tablet (50 mg total) by mouth daily.   methocarbamol (ROBAXIN) 500 MG tablet Take 1 tablet (500 mg total) by mouth every 8 (eight) hours as needed for muscle spasms. (Patient not taking: Reported on 06/10/2021)   sertraline (ZOLOFT) 50 MG tablet Take 1 daily for anxiety.   sildenafil (VIAGRA) 50 MG tablet Take 0.5-1 tablets (25-50 mg total) by mouth daily as needed for erectile dysfunction. (  Patient not taking: Reported on 06/10/2021)   No current facility-administered medications on file prior to visit.     Allergies:   Patient has no known allergies.   Social History   Tobacco Use   Smoking status: Every Day    Packs/day: 0.65    Years: 40.00    Total pack years: 26.00    Types: Cigarettes   Smokeless tobacco: Never   Tobacco comments:    approxiamately 5-6 cigs/day  Vaping Use   Vaping Use: Never used  Substance Use Topics   Alcohol use: Yes    Alcohol/week: 3.0 standard drinks of alcohol    Types: 3 Cans of beer per week   Drug use: No    Family  History: family history includes Diabetes in his brother, mother, and sister. There is no history of Colon cancer, Esophageal cancer, Rectal cancer, or Stomach cancer.  ROS:   Please see the history of present illness.   (+) Intermittent sharp chest pain (+) Hard heart beats  Additional pertinent ROS otherwise unremarkable.   EKGs/Labs/Other Studies Reviewed:    The following studies were reviewed today:  ETT 10/17/19 Blood pressure demonstrated a normal response to exercise. Upsloping ST segment depression ST segment depression was noted during stress.   Negative adequate stress test.   EKG:  EKG is personally reviewed.   05/05/22: Sinus ***. Rate *** bpm. 06/10/2021: sinus tachycardia at 104 bpm, nonspecific t wave pattern 09/26/19: NSR at 79 bpm, nonspecific t wave flattening  Recent Labs: No results found for requested labs within last 365 days.   Recent Lipid Panel    Component Value Date/Time   CHOL 195 11/15/2018 1605   TRIG 114 11/15/2018 1605   HDL 39 (L) 11/15/2018 1605   CHOLHDL 5.0 11/15/2018 1605   LDLCALC 133 (H) 11/15/2018 1605    Physical Exam:    VS:  There were no vitals taken for this visit.    Wt Readings from Last 3 Encounters:  06/10/21 234 lb 12.8 oz (106.5 kg)  06/11/20 226 lb 3.2 oz (102.6 kg)  04/02/20 224 lb 12.8 oz (102 kg)    GEN: Well nourished, well developed in no acute distress HEENT: Normal, moist mucous membranes NECK: No JVD CARDIAC: ***tachycardic, regular rhythm, normal S1 and S2, no rubs or gallops. No murmur. VASCULAR: Radial and DP pulses 2+ bilaterally. No carotid bruits RESPIRATORY:  Clear to auscultation without rales, wheezing or rhonchi  ABDOMEN: Soft, non-tender, non-distended MUSCULOSKELETAL:  Ambulates independently SKIN: Warm and dry, no edema NEUROLOGIC:  Alert and oriented x 3. No focal neuro deficits noted. PSYCHIATRIC:  Normal affect   ASSESSMENT:    No diagnosis found.  PLAN:    Chest pain: -atypical  symptoms, related to movement/reaching -normal ETT prior -suspect noncardiac in etiology, but discussed red flag warning signs that need immediate medical attention  Hypertension: elevated today -continue amlodipine, losartan -reviewed how to monitor BP -if becomes elevated, would switch losartan to valsartan or add chlorthalidone -wants to work on lifestyle, see below  Snoring Daytime fatigue AM headaches -will get sleep study, preferably home study -would benefit from CPAP if OSA found  Tobacco use and counseling: The patient was counseled on tobacco cessation today for 6 minutes.  Counseling included reviewing the risks of smoking tobacco products, how it impacts the patient's current medical diagnoses and different strategies for quitting.  Pharmacotherapy to aid in tobacco cessation was not prescribed today.   Hypercholesterolemia: -LDL 133 -with elevated ASCVD risk, would recommend  a statin -we have discussed, he would like to work on lifestyle first  We discussed diet and exercise recommendations at length today. Cardiac risk counseling and prevention recommendations: -recommend heart healthy/Mediterranean diet, with whole grains, fruits, vegetable, fish, lean meats, nuts, and olive oil. Limit salt. -recommend moderate walking, 3-5 times/week for 30-50 minutes each session. Aim for at least 150 minutes.week. Goal should be pace of 3 miles/hours, or walking 1.5 miles in 30 minutes -recommend avoidance of tobacco products. Avoid excess alcohol. -ASCVD risk score: The ASCVD Risk score (Arnett DK, et al., 2019) failed to calculate for the following reasons:   Cannot find a previous HDL lab    Plan for follow up: 6 months or sooner as needed. 1 week for repeat EKG.  Buford Dresser, MD, PhD [place]  [facility]    Medication Adjustments/Labs and Tests Ordered: Current medicines are reviewed at length with the patient today.  Concerns regarding medicines are  outlined above.   No orders of the defined types were placed in this encounter.  No orders of the defined types were placed in this encounter.  There are no Patient Instructions on file for this visit.    I,Breanna Adamick,acting as a Education administrator for [employer], MD.,have documented all relevant documentation on the behalf of Buford Dresser, MD,as directed by  Buford Dresser, MD while in the presence of Buford Dresser, MD.   I, Bubba Hales Adamick, have reviewed all documentation for this visit. The documentation on 05/05/22 for the exam, diagnosis, procedures, and orders are all accurate and complete.   Signed, Buford Dresser, MD PhD 05/05/2022   [place]

## 2022-05-05 NOTE — Patient Instructions (Addendum)
Medication Instructions:  On your ECG, your QT interval is longer than before. This is likely a combination of the wellbutrin, the diphenhydramine in the Tylenol PM, and possibly abnormal electrolytes. We will check electrolytes to make sure this isn't the issue.   You can check any medications to see if they will affect the QT on [email or web address] *If you need a refill on your cardiac medications before your next appointment, please call your pharmacy*   Lab Work: Your physician recommends that you return for lab work today- BMP and Mag  If you have labs (blood work) drawn today and your tests are completely normal, you will receive your results only by: MyChart Message (if you have MyChart) OR A paper copy in the mail If you have any lab test that is abnormal or we need to change your treatment, we will call you to review the results.  Follow-Up: At [facility], you and your health needs are our priority.  As part of our continuing mission to provide you with exceptional heart care, we have created designated Provider Care Teams.  These Care Teams include your primary Cardiologist (physician) and Advanced Practice Providers (APPs -  Physician Assistants and Nurse Practitioners) who all work together to provide you with the care you need, when you need it.  We recommend signing up for the patient portal called "MyChart".  Sign up information is provided on this After Visit Summary.  MyChart is used to connect with patients for Virtual Visits (Telemedicine).  Patients are able to view lab/test results, encounter notes, upcoming appointments, etc.  Non-urgent messages can be sent to your provider as well.   To learn more about what you can do with MyChart, go to [email or web address].    Your next appointment:   Please follow up in 1-2 weeks for a nurse visit to recheck your EKG       &   6 months with Dr. Cristal Deer

## 2022-05-06 LAB — BASIC METABOLIC PANEL
BUN/Creatinine Ratio: 6 — ABNORMAL LOW (ref 10–24)
BUN: 9 mg/dL (ref 8–27)
CO2: 23 mmol/L (ref 20–29)
Calcium: 10.5 mg/dL — ABNORMAL HIGH (ref 8.6–10.2)
Chloride: 98 mmol/L (ref 96–106)
Creatinine, Ser: 1.53 mg/dL — ABNORMAL HIGH (ref 0.76–1.27)
Glucose: 112 mg/dL — ABNORMAL HIGH (ref 70–99)
Potassium: 3.1 mmol/L — ABNORMAL LOW (ref 3.5–5.2)
Sodium: 140 mmol/L (ref 134–144)
eGFR: 51 mL/min/{1.73_m2} — ABNORMAL LOW (ref >59)

## 2022-05-06 LAB — MAGNESIUM: Magnesium: 2.3 mg/dL (ref 1.6–2.3)

## 2022-05-10 ENCOUNTER — Encounter (HOSPITAL_BASED_OUTPATIENT_CLINIC_OR_DEPARTMENT_OTHER): Payer: Self-pay | Admitting: Cardiology

## 2022-05-11 ENCOUNTER — Encounter (HOSPITAL_BASED_OUTPATIENT_CLINIC_OR_DEPARTMENT_OTHER): Payer: Self-pay | Admitting: Cardiology

## 2022-05-11 ENCOUNTER — Telehealth (HOSPITAL_BASED_OUTPATIENT_CLINIC_OR_DEPARTMENT_OTHER): Payer: Self-pay

## 2022-05-11 DIAGNOSIS — I10 Essential (primary) hypertension: Secondary | ICD-10-CM

## 2022-05-11 DIAGNOSIS — E876 Hypokalemia: Secondary | ICD-10-CM

## 2022-05-11 NOTE — Telephone Encounter (Signed)
Reviewed with patient and he will increase high potassium foods Rescheduled nurse visit EKG for 12/21 and he will go for labs afterwards per Dr Cristal Deer

## 2022-05-11 NOTE — Telephone Encounter (Signed)
This encounter was created in error - please disregard.

## 2022-05-11 NOTE — Telephone Encounter (Addendum)
Left message for patient to call back     ----- Message from Jodelle Red, MD sent at 05/10/2022  5:54 PM EST ----- Potassium is a little low. Magnesium is normal. I would recommend either eating potassium rich foods (easy to google) or taking 20 mEq of potassium pills for the next 5 days and rechecking labs next week. This may be part of why his ECG was a little abnormal, but stopping the tylenol PM is the most important part.

## 2022-05-11 NOTE — Telephone Encounter (Signed)
Follow Up:       Returning Jerry Greene's call from today.

## 2022-05-11 NOTE — Addendum Note (Signed)
Addended by: Regis Bill B on: 05/11/2022 03:56 PM   Modules accepted: Orders

## 2022-05-12 ENCOUNTER — Ambulatory Visit (HOSPITAL_BASED_OUTPATIENT_CLINIC_OR_DEPARTMENT_OTHER): Payer: Managed Care, Other (non HMO)

## 2022-05-18 ENCOUNTER — Ambulatory Visit (HOSPITAL_BASED_OUTPATIENT_CLINIC_OR_DEPARTMENT_OTHER): Payer: Managed Care, Other (non HMO) | Admitting: *Deleted

## 2022-05-18 ENCOUNTER — Telehealth (HOSPITAL_BASED_OUTPATIENT_CLINIC_OR_DEPARTMENT_OTHER): Payer: Self-pay | Admitting: Family

## 2022-05-18 DIAGNOSIS — I10 Essential (primary) hypertension: Secondary | ICD-10-CM | POA: Diagnosis not present

## 2022-05-18 DIAGNOSIS — R9431 Abnormal electrocardiogram [ECG] [EKG]: Secondary | ICD-10-CM

## 2022-05-18 NOTE — Telephone Encounter (Signed)
Left message for patient to call and schedule the Echocardiogram that was ordered during the 05/18/22 Nurse Visit

## 2022-05-18 NOTE — Progress Notes (Signed)
   Nurse Visit   Date of Encounter: 05/18/2022 ID: Bradley Bostelman, DOB 1961/02/15, MRN [id number]  PCP:  Filomena Jungling, NP   [facility] Providers Cardiologist:  Jodelle Red, MD      Visit Details   VS:  There were no vitals taken for this visit. , BMI There is no height or weight on file to calculate BMI.  Wt Readings from Last 3 Encounters:  06/10/21 234 lb 12.8 oz (106.5 kg)  06/11/20 226 lb 3.2 oz (102.6 kg)  04/02/20 224 lb 12.8 oz (102 kg)     Reason for visit: ELG Performed today: EKG and Provider consulted:   EKG reviewed by Dr Cristal Deer, sinus  Changes (medications, testing, etc.) : No changes  Length of Visit: 10 minutes    Medications Adjustments/Labs and Tests Ordered: No orders of the defined types were placed in this encounter.  No orders of the defined types were placed in this encounter.    Candace Gallus, LPN  54/56/2563 1:26 PM

## 2022-05-18 NOTE — Patient Instructions (Signed)
Medication Instructions:  Your physician recommends that you continue on your current medications as directed. Please refer to the Current Medication list given to you today.   Labwork: GO UPSTAIRS FOR LABS TODAY   Testing/Procedures: none  Follow-Up: KEEP FOLLOW UP AS SCHEDULED IN [place]

## 2022-05-19 LAB — BASIC METABOLIC PANEL
BUN/Creatinine Ratio: 8 — ABNORMAL LOW (ref 10–24)
BUN: 11 mg/dL (ref 8–27)
CO2: 26 mmol/L (ref 20–29)
Calcium: 10.4 mg/dL — ABNORMAL HIGH (ref 8.6–10.2)
Chloride: 99 mmol/L (ref 96–106)
Creatinine, Ser: 1.36 mg/dL — ABNORMAL HIGH (ref 0.76–1.27)
Glucose: 90 mg/dL (ref 70–99)
Potassium: 4.1 mmol/L (ref 3.5–5.2)
Sodium: 139 mmol/L (ref 134–144)
eGFR: 59 mL/min/{1.73_m2} — ABNORMAL LOW (ref >59)

## 2022-11-17 ENCOUNTER — Ambulatory Visit (HOSPITAL_BASED_OUTPATIENT_CLINIC_OR_DEPARTMENT_OTHER): Payer: Managed Care, Other (non HMO) | Admitting: Cardiology
